# Patient Record
Sex: Female | Born: 1970 | Hispanic: Refuse to answer | Marital: Single | Smoking: Never smoker
Health system: Southern US, Community
[De-identification: ages and names within clinical notes are randomized; demographics above are authoritative.]

## PROBLEM LIST (undated history)

## (undated) DIAGNOSIS — E063 Autoimmune thyroiditis: Secondary | ICD-10-CM

## (undated) DIAGNOSIS — M359 Systemic involvement of connective tissue, unspecified: Secondary | ICD-10-CM

## (undated) DIAGNOSIS — M329 Systemic lupus erythematosus, unspecified: Secondary | ICD-10-CM

## (undated) DIAGNOSIS — G7000 Myasthenia gravis without (acute) exacerbation: Secondary | ICD-10-CM

## (undated) HISTORY — DX: Systemic lupus erythematosus, unspecified: M32.9

## (undated) HISTORY — DX: Myasthenia gravis without (acute) exacerbation: G70.00

## (undated) HISTORY — PX: WISDOM TOOTH EXTRACTION: SHX21

---

## 2008-07-29 ENCOUNTER — Inpatient Hospital Stay (HOSPITAL_COMMUNITY): Admission: AD | Admit: 2008-07-29 | Discharge: 2008-07-29 | Payer: Self-pay | Admitting: Obstetrics & Gynecology

## 2009-07-20 ENCOUNTER — Encounter: Admission: RE | Admit: 2009-07-20 | Discharge: 2009-07-21 | Payer: Self-pay | Admitting: Internal Medicine

## 2011-08-05 LAB — URINALYSIS, ROUTINE W REFLEX MICROSCOPIC
Glucose, UA: 500 — AB
Nitrite: POSITIVE — AB
Specific Gravity, Urine: >1.03 — ABNORMAL HIGH
pH: 6.5

## 2011-08-05 LAB — URINE MICROSCOPIC-ADD ON

## 2012-07-03 ENCOUNTER — Encounter (HOSPITAL_COMMUNITY): Payer: Self-pay | Admitting: Emergency Medicine

## 2012-07-03 ENCOUNTER — Emergency Department (HOSPITAL_COMMUNITY)
Admission: EM | Admit: 2012-07-03 | Discharge: 2012-07-03 | Disposition: A | Discharge status: Home / Self Care | Payer: Self-pay | Source: Home / Self Care | Attending: Emergency Medicine | Admitting: Emergency Medicine

## 2012-07-03 DIAGNOSIS — E039 Hypothyroidism, unspecified: Secondary | ICD-10-CM

## 2012-07-03 HISTORY — DX: Autoimmune thyroiditis: E06.3

## 2012-07-03 HISTORY — DX: Systemic involvement of connective tissue, unspecified: M35.9

## 2012-07-03 MED ORDER — LEVOTHYROXINE SODIUM 125 MCG PO TABS: 125.0000 ug | ORAL_TABLET | Freq: Every day | ORAL | Status: DC

## 2012-07-03 NOTE — ED Provider Notes (Signed)
Chief Complaint  Patient presents with  . Hypothyroidism    History of Present Illness:   Kathryn Collier is a 41 year old female who has an 18 year history of hypothyroidism. This is caused by Hashimoto's thyroiditis. She has undifferentiated connective tissue disorder and is on Plaquenil for that. She's been on Synthroid these past 18 years. About 2 or 3 weeks ago she had a TSH drawn at [facility]. It was found to be elevated at 9.53 and she was told to return here for followup. She is currently on Synthroid 100 mcg per day she does note some symptoms of hypothyroidism including lack of energy, fatigue, tiredness, weight gain, aching in her muscles, particularly her neck, cold intolerance, and constipation. She denies any hair or skin changes.  Review of Systems:  Other than noted above, the patient denies any of the following symptoms. Systemic:  No fever, chills, sweats, fatigue, myalgias, headache, or anorexia. Eye:  No redness, pain or drainage. ENT:  No earache, nasal congestion, rhinorrhea, sinus pressure, or sore throat. Lungs:  No cough, sputum production, wheezing, shortness of breath.  Cardiovascular:  No chest pain, palpitations, or syncope. GI:  No nausea, vomiting, abdominal pain or diarrhea. GU:  No dysuria, frequency, or hematuria. Skin:  No rash or pruritis.  PMFSH:  Past medical history, family history, social history, meds, and allergies were reviewed.   Physical Exam:   Vital signs:  BP 132/91  Pulse 104  Temp 99.3 F (37.4 C) (Oral)  Resp 16  SpO2 99%  LMP 06/22/2012 General:  Alert, in no distress. Eye:  PERRL, full EOMs.  Lids and conjunctivas were normal. Neck:  Supple, no adenopathy, tenderness or mass. Thyroid was normal. Lungs:  No respiratory distress.  Lungs were clear to auscultation, without wheezes, rales or rhonchi.  Breath sounds were clear and equal bilaterally. Heart:  Regular rhythm, without gallops, murmers or rubs. Abdomen:  Soft,  flat, and non-tender to palpation.  No hepatosplenomagaly or mass. Skin:  Clear, warm, and dry, without rash or lesions.  Assessment:  The encounter diagnosis was Hypothyroidism.  Plan:   1.  The following meds were prescribed:   New Prescriptions   LEVOTHYROXINE (SYNTHROID) 125 MCG TABLET    Take 1 tablet (125 mcg total) by mouth daily.   2.  The patient was instructed in symptomatic care and handouts were given. 3.  The patient was told to return if becoming worse in any way, if no better in 3 or 4 days, and given some red flag symptoms that would indicate earlier return. The patient was told to follow up with her primary care physician in about a month for repeat TSH.    Reuben Likes, MD 07/03/12 [phone]

## 2012-07-03 NOTE — ED Notes (Signed)
[facility].  Reports being called recently about July 19 blood draw:reports TSH was called to her as 9.435.

## 2012-08-04 ENCOUNTER — Other Ambulatory Visit (HOSPITAL_COMMUNITY): Payer: Self-pay | Admitting: Family Medicine

## 2012-08-04 ENCOUNTER — Other Ambulatory Visit: Payer: Self-pay | Admitting: Rehabilitation

## 2012-08-04 DIAGNOSIS — E039 Hypothyroidism, unspecified: Secondary | ICD-10-CM

## 2012-08-12 ENCOUNTER — Ambulatory Visit (HOSPITAL_COMMUNITY)
Admission: RE | Admit: 2012-08-12 | Discharge: 2012-08-12 | Disposition: A | Discharge status: Home / Self Care | Payer: Medicaid Other | Source: Ambulatory Visit | Attending: Family Medicine | Admitting: Family Medicine

## 2012-08-12 DIAGNOSIS — E039 Hypothyroidism, unspecified: Secondary | ICD-10-CM

## 2012-08-12 DIAGNOSIS — E0590 Thyrotoxicosis, unspecified without thyrotoxic crisis or storm: Secondary | ICD-10-CM | POA: Insufficient documentation

## 2014-08-24 ENCOUNTER — Other Ambulatory Visit: Payer: Self-pay | Admitting: Obstetrics and Gynecology

## 2014-08-24 DIAGNOSIS — Z1231 Encounter for screening mammogram for malignant neoplasm of breast: Secondary | ICD-10-CM

## 2014-09-13 ENCOUNTER — Other Ambulatory Visit: Payer: Self-pay | Admitting: Obstetrics and Gynecology

## 2014-09-13 ENCOUNTER — Encounter (INDEPENDENT_AMBULATORY_CARE_PROVIDER_SITE_OTHER): Payer: Self-pay

## 2014-09-13 ENCOUNTER — Ambulatory Visit
Admission: RE | Admit: 2014-09-13 | Discharge: 2014-09-13 | Disposition: A | Discharge status: Home / Self Care | Payer: Medicaid Other | Source: Ambulatory Visit | Attending: Obstetrics and Gynecology | Admitting: Obstetrics and Gynecology

## 2014-09-13 DIAGNOSIS — Z1231 Encounter for screening mammogram for malignant neoplasm of breast: Secondary | ICD-10-CM

## 2014-09-14 ENCOUNTER — Other Ambulatory Visit: Payer: Self-pay | Admitting: Obstetrics and Gynecology

## 2014-09-14 DIAGNOSIS — R928 Other abnormal and inconclusive findings on diagnostic imaging of breast: Secondary | ICD-10-CM

## 2014-09-23 ENCOUNTER — Ambulatory Visit
Admission: RE | Admit: 2014-09-23 | Discharge: 2014-09-23 | Disposition: A | Discharge status: Home / Self Care | Payer: No Typology Code available for payment source | Source: Ambulatory Visit | Attending: Obstetrics and Gynecology | Admitting: Obstetrics and Gynecology

## 2014-09-23 DIAGNOSIS — R928 Other abnormal and inconclusive findings on diagnostic imaging of breast: Secondary | ICD-10-CM

## 2014-11-29 ENCOUNTER — Ambulatory Visit: Payer: 59 | Attending: Pulmonary Disease | Admitting: Physical Therapy

## 2014-11-29 ENCOUNTER — Telehealth: Payer: Self-pay | Admitting: *Deleted

## 2014-11-29 DIAGNOSIS — M329 Systemic lupus erythematosus, unspecified: Secondary | ICD-10-CM | POA: Diagnosis not present

## 2014-11-29 DIAGNOSIS — M542 Cervicalgia: Secondary | ICD-10-CM | POA: Diagnosis not present

## 2014-11-29 DIAGNOSIS — M546 Pain in thoracic spine: Secondary | ICD-10-CM | POA: Insufficient documentation

## 2014-11-29 NOTE — Therapy (Addendum)
[place], [place], [address] Phone: [phone]   Fax:  [phone]  Physical Therapy Evaluation/Discharge  Patient Details  Name: Kathryn Collier MRN: [id number] Date of Birth: 03/21/1979 Referring Provider:  Steva Colder, PA-C  Encounter Date: 11/29/2014      PT End of Session - 11/29/14 1217    Visit Number 1   Number of Visits 9   Date for PT Re-Evaluation 01/24/15   PT Start Time 1106   PT Stop Time 1205   PT Time Calculation (min) 59 min   Activity Tolerance Patient tolerated treatment well   Behavior During Therapy [facility] for tasks assessed/performed      Past Medical History  Diagnosis Date  . Connective tissue disease   . Hashimoto's disease     No past surgical history on file.  There were no vitals taken for this visit.  Visit Diagnosis:  Cervicalgia  Lupus (systemic lupus erythematosus)  Bilateral thoracic back pain  Neck pain, bilateral      Subjective Assessment - 11/29/14 1117    Symptoms Pt states she has pain in neck L> R, lupus general pain symptoms/ connective tissue diseases   Pertinent History Lupus, hypothyroidism   Limitations Sitting  drivng car is the worst   How long can you sit comfortably? 5 minutes  especially while driving   How long can you stand comfortably? unlimited   How long can you walk comfortably? unlimited   Patient Stated Goals Pt would like to be able to work out at gym safely, establish a HEP, body mechanics for lifting , caring for children   Currently in Pain? Yes   Pain Score 8   1/10 at rest   Pain Location Neck   Pain Orientation Left;Right   Pain Descriptors / Indicators Tightness;Aching;Throbbing   Pain Type Chronic pain   Pain Onset More than a month ago   Pain Frequency Intermittent   Aggravating Factors  sitting and driving, cold weather, wt lifting acitivities   Pain Relieving Factors heat, medication   Multiple Pain Sites  Yes   Pain Score 8  0/10   Pain Type Chronic pain   Pain Location Jaw  Pt with no pain but at times has bil jaw pain   Pain Orientation Left;Right   Pain Descriptors / Indicators Aching;Spasm   Pain Frequency Occasional   Pain Onset Gradual          [facility] PT Assessment - 11/29/14 1120    Assessment   Medical Diagnosis Lupus related pain, cervicalgia and bilateral  spine pain   Onset Date 11/29/04   Prior Therapy yes  chronic problems   Precautions   Precautions --  photo sensitive   Balance Screen   Has the patient fallen in the past 6 months No   Has the patient had a decrease in activity level because of a fear of falling?  No   Is the patient reluctant to leave their home because of a fear of falling?  No   Home Environment   Living Enviornment Private residence   Living Arrangements Spouse/significant other   Type of [place] to enter   Entrance Stairs-Number of Steps 3   Entrance Stairs-Rails None   Home Layout One level   Prior Function   Level of Independence Independent with basic ADLs;Independent with homemaking with ambulation;Independent with gait   Vocation Part time employment  nanny   Cognition  Overall Cognitive Status Within Functional Limits for tasks assessed   Observation/Other Assessments   Observations slender    Focus on Therapeutic Outcomes (FOTO)  FOTO limitation 39% predicted 32%   Sensation   Light Touch Appears Intact   Posture/Postural Control   Posture/Postural Control Postural limitations   Postural Limitations Rounded Shoulders;Forward head;Anterior pelvic tilt   AROM   Overall AROM  Within functional limits for tasks performed  UE and LE's   Cervical Flexion 35  tightness   Cervical Extension 45  feels like it needs to pop   Cervical - Right Side Bend 42  Pt with end range pop/crack   Cervical - Left Side Bend 34   Cervical - Right Rotation 60   Cervical - Left Rotation 50  ER tightness   Strength    Overall Strength Within functional limits for tasks performed   Overall Strength Comments deep flexior cervical 3-/5 in supine unable to maintain > 10 seconds   Cervical Flexion 4/5   Cervical Extension 4-/5   Cervical - Right Side Bend 4/5   Cervical - Left Side Bend 4/5   Cervical - Right Rotation 4/5   Cervical - Left Rotation 4/5   Palpation   Palpation Tenderness over Left cervical paraspinals > than R                  [facility] Adult PT Treatment/Exercise - 11/29/14 1120    Neck Exercises: Stretches   Upper Trapezius Stretch 30 seconds;2 reps  VC bil   Levator Stretch 30 seconds;2 reps  Bil VC L> R   Chest Stretch 2 reps;30 seconds  VC bil   Other Neck Stretches chin retraction 30 sec 2 reps   Other Neck Stretches scapular Retraction  10 reps   Manual Therapy   Manual Therapy --  for assessment. C-4 more anterior than rest   Joint Mobilization Grade 2 PA                PT Education - 11/29/14 1216    Education provided Yes   Education Details Pt reviewed stretches and emphasized gentleness with stretch from former PT.  Given handout with updated instructions   Person(s) Educated Patient   Methods Explanation;Demonstration;Verbal cues;Handout  for verbal instructions for previous exercises   Comprehension Verbalized understanding          PT Short Term Goals - 11/29/14 1749    PT SHORT TERM GOAL #1   Title Pt will be independent with initial HEP   Time 4   Period Weeks   Status New   PT SHORT TERM GOAL #2   Title Pt will have decrease in neck pain while driving from 1/02 to 4/10   Time 4   Period Weeks   Status New   PT SHORT TERM GOAL #3   Title Pt will be informed of possible community wellness programs and given informatin for energy conservation   Time 4   Period Weeks   Status New           PT Long Term Goals - 11/29/14 1750    PT LONG TERM GOAL #1   Title Pt will be independent with advanced HEP for Core  strengthening/neuromuscular control within pt tolerance   Time 8   Period Weeks   Status New   PT LONG TERM GOAL #2   Title Pt will increase AROM of neck in order to drive with 5/85 or less pain for 1 hour or more  Time 8   Period Weeks   Status New   PT LONG TERM GOAL #3   Title Improve FOTO from39% to at least 32% for maximal functional mobility    Time 8   Period Weeks   Status New   PT LONG TERM GOAL #4   Title Pt will be able return to gym and participate with light weights and low impact aerobics without exacerbating pain.   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  Pt is photo sensitive to light.  Pt has general pain and also specific cervical pain bilateral L> R.  Pt was a swimmer in high school and very active , but photo sensitivity limits time outdoors.  Pt has chronic pain and want to learn to deal with pain and exacerbations.  Pt would like to increase strength of cervical and upper back area in order to return to gym and lift light weights and be proactive about health.  Pt would benefit from skilled PT to update a progressive HEP and to pursue some appropriate community wellness programs.  Pt will benefit from weekly updates to manage exercise through occasional flares with connective tissue disease. Pt will benefit from possible dry needling and other modalities for pain management as pt tolerates. 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   Pt will benefit from skilled therapeutic intervention in order to improve on the following  deficits Pain;Decreased activity tolerance;Decreased endurance;Postural dysfunction;Improper body mechanics;Decreased range of motion;Decreased strength;Increased fascial restricitons;Increased muscle spasms  , connective tissue inflammation control and enery conservation   Rehab Potential Good   PT Frequency 1x / week   PT Duration 8 weeks   PT Treatment/Interventions ADLs/Self Care Home Management;Electrical Stimulation;Cryotherapy;Other (comment);Moist Heat;Ultrasound;Functional mobility training;Therapeutic activities;Therapeutic exercise;Neuromuscular re-education;Patient/family education;Dry needling;Energy conservation;Passive range of motion  iontophoresis   PT Next Visit Plan Continue with possible dry needling and also strengthening for scapular and neck          Problem List There are no active problems to display for this patient.  Voncille Lo, PT 11/29/2014 5:56 PM Phone: [phone] Fax: [place], [place], [address] Phone: [phone]   Fax:  [phone]   By signing I understand that I am ordering/authorizing the use of Iontophoresis using 4 mg/mL of dexamethasone as a component of this plan of care.   Discharged on 01-31-15 due to pt not returning to clinic.  Pt was called and she feels she does not need PT for now.  She would like to save appointments for later if she has a flare of her Lupus symptoms.  Pt will discharge with initial home program.

## 2014-11-29 NOTE — Patient Instructions (Signed)
Continue with Levator stretch and Upper trap stretches that you began at another therapy.  Pt  Reviewed upper trap, levator. Scapular squeeze and pectoral door way  Stretch  Each stretch hold for 30 seconds , rest and then repeat, gentle stretch.  For chin tuck, lie down and nod head 10 times with 3 sec hold at end

## 2014-11-29 NOTE — Telephone Encounter (Signed)
appts made and printed...td 

## 2014-12-06 ENCOUNTER — Ambulatory Visit: Payer: 59 | Admitting: Physical Therapy

## 2014-12-13 ENCOUNTER — Encounter: Payer: 59 | Admitting: Physical Therapy

## 2014-12-20 ENCOUNTER — Encounter: Payer: 59 | Admitting: Physical Therapy

## 2014-12-27 ENCOUNTER — Encounter: Payer: 59 | Admitting: Physical Therapy

## 2015-01-03 ENCOUNTER — Encounter: Payer: 59 | Admitting: Physical Therapy

## 2015-01-10 ENCOUNTER — Encounter: Payer: 59 | Admitting: Physical Therapy

## 2015-01-17 ENCOUNTER — Encounter: Payer: 59 | Admitting: Physical Therapy

## 2015-01-24 ENCOUNTER — Encounter: Payer: 59 | Admitting: Physical Therapy

## 2015-10-26 DIAGNOSIS — Z79899 Other long term (current) drug therapy: Secondary | ICD-10-CM | POA: Insufficient documentation

## 2015-10-26 DIAGNOSIS — M542 Cervicalgia: Secondary | ICD-10-CM | POA: Insufficient documentation

## 2015-10-26 DIAGNOSIS — E039 Hypothyroidism, unspecified: Secondary | ICD-10-CM | POA: Insufficient documentation

## 2015-10-26 DIAGNOSIS — H52223 Regular astigmatism, bilateral: Secondary | ICD-10-CM | POA: Insufficient documentation

## 2015-10-26 DIAGNOSIS — M329 Systemic lupus erythematosus, unspecified: Secondary | ICD-10-CM | POA: Insufficient documentation

## 2016-05-31 DIAGNOSIS — E559 Vitamin D deficiency, unspecified: Secondary | ICD-10-CM | POA: Insufficient documentation

## 2016-12-06 DIAGNOSIS — E063 Autoimmune thyroiditis: Secondary | ICD-10-CM | POA: Insufficient documentation

## 2016-12-06 DIAGNOSIS — J0181 Other acute recurrent sinusitis: Secondary | ICD-10-CM | POA: Insufficient documentation

## 2017-06-10 ENCOUNTER — Encounter (INDEPENDENT_AMBULATORY_CARE_PROVIDER_SITE_OTHER): Payer: Self-pay

## 2017-06-10 ENCOUNTER — Encounter: Payer: Self-pay | Admitting: Diagnostic Neuroimaging

## 2017-06-10 ENCOUNTER — Ambulatory Visit (INDEPENDENT_AMBULATORY_CARE_PROVIDER_SITE_OTHER): Payer: BLUE CROSS/BLUE SHIELD | Admitting: Diagnostic Neuroimaging

## 2017-06-10 VITALS — BP 118/74 | HR 84 | Ht 66.0 in | Wt 149.4 lb

## 2017-06-10 DIAGNOSIS — G7000 Myasthenia gravis without (acute) exacerbation: Secondary | ICD-10-CM

## 2017-06-10 MED ORDER — PYRIDOSTIGMINE BROMIDE 60 MG PO TABS: 30.0000 mg | ORAL_TABLET | Freq: Three times a day (TID) | ORAL | 6 refills | Status: DC

## 2017-06-10 NOTE — Patient Instructions (Signed)
-   start pyridostigmine 30-55m twice a day up to three times per day  - check CT chest (rule out thymoma)  - will look up evobrutinib and discuss with medical monitor for study (about safety of restarting or clinical significant for aggravating MG)

## 2017-06-10 NOTE — Progress Notes (Signed)
[facility]  PATIENT: Kathryn Collier DOB: 12/21/1970  REFERRING CLINICIAN: S Furr HISTORY FROM: patient and SO REASON FOR VISIT: new consult   HISTORICAL  CHIEF COMPLAINT:  Chief Complaint  Patient presents with  . Ptosis    rm 6, New Pt, sig other- Chris, "ptosis, left eye"    HISTORY OF PRESENT ILLNESS:   46 year old female here for evaluation of myasthenia gravis.  Patient has history of SLE, undifferentiated connective tissue disease, Hashimoto's hypothyroidism, currently on hydroxychloroquine, diclofenac, levothyroxine, developed onset of left eye drooping eyelid on 05/03/2017. No double vision. Patient has had intermittent slurred speech and swallow difficulty for past 15 years. Patient went to eye doctor who checked myasthenia gravis antibodies, which returned positive. Patient referred to me for further evaluation.  Of note patient has been involved in phase II clinical research study with medication "evobrutinib" for lupus, from February 25, 2017 - May 20, 2017. Patient also was off of medication from July 9 until July or 13, 2018.   Patient denies any generalized muscle weakness problems, major swallowing or speech difficulties, double vision. Patient continues to have fatigable weakness in left eyelid.    REVIEW OF SYSTEMS: Full 14 system review of systems performed and negative with exception of: Allergies easy bruising obstructed left eye vision.   ALLERGIES: Allergies  Allergen Reactions  . Corticosteroids Other (See Comments)    Rage, irritability, dperession  . Other     Mold, dust mites, pollens of trees/grass  . Prednisone Other (See Comments)    "depression, rage, irritability"  . Sertraline Other (See Comments)    No NSAIDS  . Sulfa Antibiotics     Not supposed to take d/t lupus    HOME MEDICATIONS: Outpatient Medications Prior to Visit  Medication Sig Dispense Refill  . buPROPion (WELLBUTRIN SR) 150 MG 12 hr tablet Take 150  mg by mouth 2 (two) times daily.    . cyclobenzaprine (FLEXERIL) 10 MG tablet Take 10 mg by mouth once.    . diclofenac (VOLTAREN) 75 MG EC tablet Take 75 mg by mouth 2 (two) times daily.    . hydroxychloroquine (PLAQUENIL) 200 MG tablet Take by mouth 2 (two) times daily.    . norethindrone-ethinyl estradiol (MICROGESTIN,JUNEL,LOESTRIN) 1-20 MG-MCG tablet Take 1 tablet by mouth daily.    Marland Kitchen levothyroxine (SYNTHROID) 100 MCG tablet Take 100 mcg by mouth daily.    Marland Kitchen levothyroxine (SYNTHROID) 125 MCG tablet Take 1 tablet (125 mcg total) by mouth daily. (Patient taking differently: Take 112 mcg by mouth daily. ) 30 tablet 11   No facility-administered medications prior to visit.     PAST MEDICAL HISTORY: Past Medical History:  Diagnosis Date  . Connective tissue disease ([place])   . Connective tissue disease ([place])    undifferentiated  . Hashimoto's disease   . Systemic lupus ([place])     PAST SURGICAL HISTORY: No past surgical history on file.  FAMILY HISTORY: Family History  Problem Relation Age of Onset  . Other Mother        brain tumor  . Cancer - Lung Father     SOCIAL HISTORY:  Social History   Social History  . Marital status: Single    Spouse name: N/A  . Number of children: 0  . Years of education: 9   Occupational History  . n     nanny   Social History Main Topics  . Smoking status: Never Smoker  . Smokeless tobacco: Never Used  . Alcohol  use Yes     Comment: 06/10/17 1-3 drinks/ 2-4 nights weekly  . Drug use: No  . Sexual activity: Not on file   Other Topics Concern  . Not on file   Social History Narrative   Lives with boyfriend   Caffeine- coffee, 1 cup daily     PHYSICAL EXAM  GENERAL EXAM/CONSTITUTIONAL: Vitals:  Vitals:   06/10/17 1108  BP: 118/74  Pulse: 84  Weight: 149 lb 6.4 oz (67.8 kg)  Height: 5' 6"  (1.676 m)     Body mass index is 24.11 kg/m.  Visual Acuity Screening   Right eye Left eye Both eyes  Without correction: 20/30  20/40   With correction:        Patient is in no distress; well developed, nourished and groomed; neck is supple  CARDIOVASCULAR:  Examination of carotid arteries is normal; no carotid bruits  Regular rate and rhythm, no murmurs  Examination of peripheral vascular system by observation and palpation is normal  EYES:  Ophthalmoscopic exam of optic discs and posterior segments is normal; no papilledema or hemorrhages  MUSCULOSKELETAL:  Gait, strength, tone, movements noted in Neurologic exam below  NEUROLOGIC: MENTAL STATUS:  No flowsheet data found.  awake, alert, oriented to person, place and time  recent and remote memory intact  normal attention and concentration  language fluent, comprehension intact, naming intact,   fund of knowledge appropriate  CRANIAL NERVE:   2nd - no papilledema on fundoscopic exam  2nd, 3rd, 4th, 6th - pupils equal and reactive to light, visual fields full to confrontation, extraocular muscles intact, no nystagmus; FATIGUABLE LEFT EYE PTOSIS  5th - facial sensation symmetric  7th - facial strength symmetric  8th - hearing intact  9th - palate elevates symmetrically, uvula midline  11th - shoulder shrug symmetric  12th - tongue protrusion midline  MOTOR:   normal bulk and tone, full strength in the BUE, BLE  SENSORY:   normal and symmetric to light touch, temperature, vibration  COORDINATION:   finger-nose-finger, fine finger movements normal  REFLEXES:   deep tendon reflexes present and symmetric  GAIT/STATION:   narrow based gait; romberg is negative    DIAGNOSTIC DATA (LABS, IMAGING, TESTING) - I reviewed patient records, labs, notes, testing and imaging myself where available.  No results found for: WBC, HGB, HCT, MCV, PLT No results found for: NA, K, CL, CO2, GLUCOSE, BUN, CREATININE, CALCIUM, PROT, ALBUMIN, AST, ALT, ALKPHOS, BILITOT, GFRNONAA, GFRAA No results found for: CHOL, HDL, LDLCALC, LDLDIRECT,  TRIG, CHOLHDL No results found for: HGBA1C No results found for: VITAMINB12 No results found for: TSH   05/15/17 AchR binding ab - 2.69 (H)    ASSESSMENT AND PLAN  46 y.o. year old female here with SLE, undifferentiated connective tissue disease, Hashimoto's hypothyroidism, currently on hydroxychloroquine, diclofenac, levothyroxine, developed onset of left eye drooping eyelid on 05/03/2017. AchR ab positive.    Dx: ocular myasthenia  No diagnosis found.   PLAN:  - start pyridostigmine 30-52m twice a day up to three times per day  - check CT chest (rule out thymoma)  - Based upon my review, I do not think study medication evobrutinib is causative in patient's new diagnosis of myasthenia gravis; in addition, we do not know whether patient was in active or placebo arm of the trial. In addition, I am not sure about the safety of restarting this medication in myasthenia gravis; would defer to her rheumatologist and medical study monitor to weigh in on this  decision as well.    Orders Placed This Encounter  Procedures  . CT CHEST W CONTRAST   Meds ordered this encounter  Medications  . pyridostigmine (MESTINON) 60 MG tablet    Sig: Take 0.5-1 tablets (30-60 mg total) by mouth 3 (three) times daily.    Dispense:  60 tablet    Refill:  6   Return in about 3 months (around 09/10/2017).    Penni Bombard, MD 06/07/7840, 28:20 AM Certified in Neurology, Neurophysiology and Neuroimaging  [facility] [address], [place], [place] [address] [phone]

## 2017-06-17 DIAGNOSIS — B009 Herpesviral infection, unspecified: Secondary | ICD-10-CM | POA: Insufficient documentation

## 2017-06-24 ENCOUNTER — Telehealth: Payer: Self-pay | Admitting: *Deleted

## 2017-06-24 ENCOUNTER — Ambulatory Visit
Admission: RE | Admit: 2017-06-24 | Discharge: 2017-06-24 | Disposition: A | Discharge status: Home / Self Care | Payer: BLUE CROSS/BLUE SHIELD | Source: Ambulatory Visit | Attending: Diagnostic Neuroimaging | Admitting: Diagnostic Neuroimaging

## 2017-06-24 ENCOUNTER — Telehealth: Payer: Self-pay | Admitting: Diagnostic Neuroimaging

## 2017-06-24 DIAGNOSIS — G7000 Myasthenia gravis without (acute) exacerbation: Secondary | ICD-10-CM

## 2017-06-24 MED ORDER — IOPAMIDOL (ISOVUE-300) INJECTION 61%
75.0000 mL | Freq: Once | INTRAVENOUS | Status: AC | PRN
  Administered 2017-06-24: 75 mL via INTRAVENOUS

## 2017-06-24 NOTE — Telephone Encounter (Signed)
Received call back from patient . She stated she is now taking Mestinon 30 mg 3 x daily and tolerating the medication well. She stated her eye is 50 % better, but she would like to increase dose again. She is asking the best schedule for increasing.  Advised she increase the morning dose only to 60 mg and give it a few weeks to realize the full effect. Advised she take 8 hours apart as much as possible. Advised her that after a few weeks, if no benefits she may increase the mid day dose. She verbalized understanding, appreciation .

## 2017-06-24 NOTE — Telephone Encounter (Signed)
LVM requesting call back.

## 2017-06-24 NOTE — Telephone Encounter (Signed)
LVM requesting patient call back for CT chest results.

## 2017-06-24 NOTE — Telephone Encounter (Signed)
Patient calling to discuss dosage of pyridostigmine (MESTINON) 60 MG tablet.

## 2017-06-24 NOTE — Telephone Encounter (Signed)
Received call back from patient., informed her that the CT chest showed no thymoma found. Advised her that other incidental findings noted: some reactive lymph nodes noted may be related to lupus. Also a small left kidney stone noted. She denied history of kidney stones. Advised that if she experiences left sided abdominal or back pain she should call her PCP to let him know of scan results and to be followed up. She requested CT results be faxed to her rheumatologist; she will see him soon. She then stated she'll call her PCP to discuss kidney stone and diet changes that he may recommend. She verbalized understanding, appreciation. Routed results notes to Dr Amil Amen, will fax tomorrow.

## 2017-07-14 ENCOUNTER — Ambulatory Visit: Payer: 59 | Admitting: Neurology

## 2017-07-29 ENCOUNTER — Telehealth: Payer: Self-pay | Admitting: Diagnostic Neuroimaging

## 2017-07-29 NOTE — Telephone Encounter (Signed)
Pt states her eye is still drooping and would like to request an increase on pyridostigmine (MESTINON) 60 MG tablet, please call

## 2017-07-29 NOTE — Telephone Encounter (Signed)
LMVM for pt to return call.

## 2017-07-29 NOTE — Telephone Encounter (Signed)
Spoke to pt and relayed that per Dr. Gladstone Lighter note that pt can increase her mestinon to 30-32m po TID.  She is currently taking 614mpo 6am and 3055mt 1-9.  She has noted L eyelid seems thicker, some vision loss/peripherally due to ptosis.  I relayed that she can take 28m87m 6am and 1pm, and keep 9pm dose at 30mg28mKnow that she can increase to taking 28mg 3mid if needed.  She will see how she does in a couple of weeks and let us knoKoreaif she needs to increase again.  Wants to keep us awa[place].

## 2017-09-11 ENCOUNTER — Telehealth: Payer: Self-pay | Admitting: Diagnostic Neuroimaging

## 2017-09-11 NOTE — Telephone Encounter (Signed)
Pt calling to inform that her eyes are acting up again and she is going to increase her medication dosage.  Please call

## 2017-09-11 NOTE — Telephone Encounter (Signed)
Agree. -VRP

## 2017-09-11 NOTE — Telephone Encounter (Signed)
I spoke to pt and she will increase to maximum of mestinon 33m po TID.  She had been taking 60-60-30 and since last week, had busy week, and noted increase in droopy eyelid.  She has appt 09-30-17 at 1500 with Dr. PLeta Baptist  Will let him know. FYI.

## 2017-09-30 ENCOUNTER — Ambulatory Visit: Payer: BLUE CROSS/BLUE SHIELD | Admitting: Diagnostic Neuroimaging

## 2017-09-30 ENCOUNTER — Encounter: Payer: Self-pay | Admitting: Diagnostic Neuroimaging

## 2017-09-30 VITALS — BP 119/76 | HR 89 | Ht 66.0 in | Wt 159.8 lb

## 2017-09-30 DIAGNOSIS — G7000 Myasthenia gravis without (acute) exacerbation: Secondary | ICD-10-CM

## 2017-09-30 NOTE — Patient Instructions (Signed)
-   continue pyridostigmine 22m three times a day; may add 30-669madditional vs 18019mR in future  - consider imuran or cellcept for myasthenia gravis

## 2017-09-30 NOTE — Progress Notes (Signed)
[facility]  PATIENT: Kathryn Collier DOB: 44/02/1971  REFERRING CLINICIAN: S Furr HISTORY FROM: patient REASON FOR VISIT: follow up    HISTORICAL  CHIEF COMPLAINT:  Chief Complaint  Patient presents with  . Follow-up    HISTORY OF PRESENT ILLNESS:   UPDATE (09/30/17, VRP): Since last visit, doing about the same. Tolerating pyridostigmine 67m three times a day. No alleviating or aggravating factors.   PRIOR HPI (06/10/17): 46year old female here for evaluation of myasthenia gravis. Patient has history of SLE, undifferentiated connective tissue disease, Hashimoto's hypothyroidism, currently on hydroxychloroquine, diclofenac, levothyroxine, developed onset of left eye drooping eyelid on 05/03/2017. No double vision. Patient has had intermittent slurred speech and swallow difficulty for past 15 years. Patient went to eye doctor who checked myasthenia gravis antibodies, which returned positive. Patient referred to me for further evaluation. Of note patient has been involved in phase II clinical research study with medication "evobrutinib" for lupus, from February 25, 2017 - May 20, 2017. Patient also was off of medication from July 9 until July or 13, 2018. Patient denies any generalized muscle weakness problems, major swallowing or speech difficulties, double vision. Patient continues to have fatigable weakness in left eyelid.   REVIEW OF SYSTEMS: Full 14 system review of systems performed and negative with exception of: Allergies easy bruising obstructed left eye vision.   ALLERGIES: Allergies  Allergen Reactions  . Corticosteroids Other (See Comments)    Rage, irritability, dperession  . Other     Mold, dust mites, pollens of trees/grass  . Prednisone Other (See Comments)    "depression, rage, irritability"  . Sertraline Other (See Comments)    No NSAIDS  . Sulfa Antibiotics     Not supposed to take d/t lupus    HOME MEDICATIONS: Outpatient Medications  Prior to Visit  Medication Sig Dispense Refill  . buPROPion (WELLBUTRIN SR) 150 MG 12 hr tablet Take 150 mg by mouth 2 (two) times daily.    . Calcium 500-125 MG-UNIT TABS Take by mouth.    . Cholecalciferol (VITAMIN D-1000 MAX ST) 1000 units tablet Take by mouth.    . cyclobenzaprine (FLEXERIL) 10 MG tablet Take 10 mg by mouth once.    . diclofenac (VOLTAREN) 75 MG EC tablet Take 75 mg by mouth 2 (two) times daily.    . hydroxychloroquine (PLAQUENIL) 200 MG tablet Take by mouth 2 (two) times daily.    .Marland Kitchenlevothyroxine (SYNTHROID, LEVOTHROID) 150 MCG tablet 150 mcg daily.    . Magnesium Gluconate 550 MG TABS Take 500 mg by mouth.    . montelukast (SINGULAIR) 10 MG tablet TK 1 T PO QD    . Multiple Vitamin (MULTI-VITAMINS) TABS Take by mouth.    . norethindrone-ethinyl estradiol (MICROGESTIN,JUNEL,LOESTRIN) 1-20 MG-MCG tablet Take 1 tablet by mouth daily.    .Marland Kitchenpyridostigmine (MESTINON) 60 MG tablet Take 0.5-1 tablets (30-60 mg total) by mouth 3 (three) times daily. 60 tablet 6  . triamcinolone (NASACORT ALLERGY 24HR) 55 MCG/ACT AERO nasal inhaler Place 2 sprays into the nose daily.    . vitamin B-12 (CYANOCOBALAMIN) 1000 MCG tablet Take by mouth.    . vitamin C (ASCORBIC ACID) 500 MG tablet Take 500 mg by mouth.     No facility-administered medications prior to visit.     PAST MEDICAL HISTORY: Past Medical History:  Diagnosis Date  . [place]   . Connective tissue disease ([place]    undifferentiated  . Hashimoto's disease   . Systemic lupus (  [place])     PAST SURGICAL HISTORY: No past surgical history on file.  FAMILY HISTORY: Family History  Problem Relation Age of Onset  . Other Mother        brain tumor  . Cancer - Lung Father     SOCIAL HISTORY:  Social History   Socioeconomic History  . Marital status: Single    Spouse name: Not on file  . Number of children: 0  . Years of education: 79  . Highest education level: Not on file  Social Needs  .  Financial resource strain: Not on file  . Food insecurity - worry: Not on file  . Food insecurity - inability: Not on file  . Transportation needs - medical: Not on file  . Transportation needs - non-medical: Not on file  Occupational History  . Occupation: n    Comment: nanny  Tobacco Use  . Smoking status: Never Smoker  . Smokeless tobacco: Never Used  Substance and Sexual Activity  . Alcohol use: Yes    Comment: 06/10/17 1-3 drinks/ 2-4 nights weekly  . Drug use: No  . Sexual activity: Not on file  Other Topics Concern  . Not on file  Social History Narrative   Lives with boyfriend   Caffeine- coffee, 1 cup daily     PHYSICAL EXAM  GENERAL EXAM/CONSTITUTIONAL: Vitals:  Vitals:   09/30/17 1506  BP: 119/76  Pulse: 89  Weight: 159 lb 12.8 oz (72.5 kg)  Height: 5' 6"  (1.676 m)   Body mass index is 25.79 kg/m. No exam data present  Patient is in no distress; well developed, nourished and groomed; neck is supple  CARDIOVASCULAR:  Examination of carotid arteries is normal; no carotid bruits  Regular rate and rhythm, no murmurs  Examination of peripheral vascular system by observation and palpation is normal  EYES:  Ophthalmoscopic exam of optic discs and posterior segments is normal; no papilledema or hemorrhages  MUSCULOSKELETAL:  Gait, strength, tone, movements noted in Neurologic exam below  NEUROLOGIC: MENTAL STATUS:  No flowsheet data found.  awake, alert, oriented to person, place and time  recent and remote memory intact  normal attention and concentration  language fluent, comprehension intact, naming intact,   fund of knowledge appropriate  CRANIAL NERVE:   2nd - no papilledema on fundoscopic exam  2nd, 3rd, 4th, 6th - pupils equal and reactive to light, visual fields full to confrontation, extraocular muscles intact, no nystagmus; MILD LEFT EYE PTOSIS  5th - facial sensation symmetric  7th - facial strength symmetric  8th - hearing  intact  9th - palate elevates symmetrically, uvula midline  11th - shoulder shrug symmetric  12th - tongue protrusion midline  MOTOR:   normal bulk and tone, full strength in the BUE, BLE  SENSORY:   normal and symmetric to light touch, temperature, vibration  COORDINATION:   finger-nose-finger, fine finger movements normal  REFLEXES:   deep tendon reflexes present and symmetric  GAIT/STATION:   narrow based gait; romberg is negative    DIAGNOSTIC DATA (LABS, IMAGING, TESTING) - I reviewed patient records, labs, notes, testing and imaging myself where available.  No results found for: WBC, HGB, HCT, MCV, PLT No results found for: NA, K, CL, CO2, GLUCOSE, BUN, CREATININE, CALCIUM, PROT, ALBUMIN, AST, ALT, ALKPHOS, BILITOT, GFRNONAA, GFRAA No results found for: CHOL, HDL, LDLCALC, LDLDIRECT, TRIG, CHOLHDL No results found for: HGBA1C No results found for: VITAMINB12 No results found for: TSH   05/15/17 AchR binding ab -  2.69 (H)    ASSESSMENT AND PLAN  46 y.o. year old female here with SLE, undifferentiated connective tissue disease, Hashimoto's hypothyroidism, currently on hydroxychloroquine, diclofenac, levothyroxine, developed onset of left eye drooping eyelid on 05/03/2017. AchR ab positive.    Dx: ocular myasthenia  1. Ocular myasthenia (HCC)      PLAN:  - continue pyridostigmine 50m three times a day; may add 30-671madditional vs 18023mR in future  - consider imuran or cellcept for myasthenia gravis  Return in about 4 months (around 01/28/2018).    VIKPenni BombardD 11/76/80/8811:10:31 Certified in Neurology, Neurophysiology and Neuroimaging  [facility] [id number]eth[address] TowneTrimbleC [id number]

## 2017-10-06 MED ORDER — PYRIDOSTIGMINE BROMIDE 60 MG PO TABS: 60.0000 mg | ORAL_TABLET | Freq: Three times a day (TID) | ORAL | 6 refills | Status: DC

## 2017-10-06 NOTE — Addendum Note (Signed)
Addended by: Eular Melnick on: 10/06/2017 01:27 PM   Modules accepted: Orders

## 2017-10-06 NOTE — Telephone Encounter (Signed)
Pt called she is taking medication 76m 1 tab tid. She needs updated qty sent to [facility]/[place]

## 2017-11-05 ENCOUNTER — Encounter: Payer: Self-pay | Admitting: Diagnostic Neuroimaging

## 2017-11-11 ENCOUNTER — Telehealth: Payer: Self-pay | Admitting: Diagnostic Neuroimaging

## 2017-11-11 NOTE — Telephone Encounter (Signed)
Called patient who stated she still didn't see Dr Gladstone Lighter e mail. This RN sent her Dr Gladstone Lighter my chart reply, and patient stated she was then able to see it. She remains concerned about her rheumatologist and Dr Leta Baptist coordinating her care and medications for her medical conditions.  She stated she will discuss Dr Gladstone Lighter information with other provider and contact this office again. She stated she may want to discuss Imuran with Dr Leta Baptist and would like to see him sooner than May. This RN advised she call with update and if needed, a sooner follow up can be scheduled. Patient verbalized understanding, appreciation.

## 2017-11-11 NOTE — Telephone Encounter (Signed)
Pt called she has not rec'd a call or email reg her concerns. I told her Dr Mamie Nick had responded to her email but sent to [facility]/RN. Pt is seeing PCP today at 2pm and would like to speak with [facility]/RN prior to the appt. Thank you.

## 2017-11-11 NOTE — Telephone Encounter (Signed)
LVM informing patient that Dr Leta Baptist replied to her my chart message on 11/06/17 with a long reply. Advised her my chart appears to be active. Suggested she check it, print his e-mail if she is able. Left number for questions.

## 2017-11-11 NOTE — Telephone Encounter (Signed)
Pt is returning call, stating that she has checked my chart but doesn't see any messages from anyone. Pt is wondering if we can print them out and she can pick them up today around lunch for her or to e-mail them. Please call to let her know

## 2017-11-12 NOTE — Telephone Encounter (Signed)
Noted  

## 2017-11-13 ENCOUNTER — Encounter: Payer: Self-pay | Admitting: Diagnostic Neuroimaging

## 2017-12-01 ENCOUNTER — Telehealth: Payer: Self-pay | Admitting: Diagnostic Neuroimaging

## 2017-12-01 NOTE — Telephone Encounter (Signed)
I called pt and relayed that per Dr. Leta Baptist ok to take flexeril for a few weeks if needed.

## 2017-12-01 NOTE — Telephone Encounter (Signed)
Takes mestinon for myasthenia gravis.

## 2017-12-01 NOTE — Telephone Encounter (Signed)
Pt has called to inform that by accident she sprained her back and would like to know how Dr Leta Baptist feels about her taking Cylobenzaprine(prescribed by another doctor) for the pain.  Please call

## 2017-12-01 NOTE — Telephone Encounter (Signed)
Ok to take cyclobenzaprine for a few weeks if needed. -VRP

## 2017-12-09 ENCOUNTER — Encounter: Payer: Self-pay | Admitting: Diagnostic Neuroimaging

## 2017-12-09 ENCOUNTER — Telehealth: Payer: Self-pay | Admitting: Diagnostic Neuroimaging

## 2017-12-09 NOTE — Telephone Encounter (Signed)
Dr. Leta Baptist will be out of the office on 03-04-18 and patient had to reschedule appointment for 05-27-18. I didt put her on a waiting list in case there is a cancellation. She feels like she needs to be seen sooner.

## 2017-12-09 NOTE — Telephone Encounter (Signed)
Called patient who was to be seen by Dr Leta Baptist in late March 2019 per office note from Nov. This RN rescheduled patient for 02/04/18. Advised she call sooner for any problems, questions.  She stated that currently she is doing well; she stated that she preferred sooner follow up as she is newly diagnosed. . She verbalized understanding, appreciation for call.

## 2017-12-23 DIAGNOSIS — G7000 Myasthenia gravis without (acute) exacerbation: Secondary | ICD-10-CM | POA: Insufficient documentation

## 2017-12-23 DIAGNOSIS — K625 Hemorrhage of anus and rectum: Secondary | ICD-10-CM | POA: Insufficient documentation

## 2018-02-04 ENCOUNTER — Encounter: Payer: Self-pay | Admitting: Diagnostic Neuroimaging

## 2018-02-04 ENCOUNTER — Ambulatory Visit: Payer: BLUE CROSS/BLUE SHIELD | Admitting: Diagnostic Neuroimaging

## 2018-02-04 VITALS — BP 125/70 | HR 94 | Ht 66.0 in | Wt 154.0 lb

## 2018-02-04 DIAGNOSIS — G7000 Myasthenia gravis without (acute) exacerbation: Secondary | ICD-10-CM

## 2018-02-04 MED ORDER — PYRIDOSTIGMINE BROMIDE 60 MG PO TABS: 60.0000 mg | ORAL_TABLET | Freq: Three times a day (TID) | ORAL | 12 refills | Status: DC

## 2018-02-04 NOTE — Progress Notes (Signed)
[facility]  PATIENT: Kathryn Collier DOB: 10/24/1971  REFERRING CLINICIAN: S Furr HISTORY FROM: patient REASON FOR VISIT: follow up    HISTORICAL  CHIEF COMPLAINT:  Chief Complaint  Patient presents with  . Ocular myasthenia    rm 7, "feeling of swelling, pressure on left eye"  . Follow-up    4 month    HISTORY OF PRESENT ILLNESS:   UPDATE (02/04/18, VRP): Since last visit, doing well. Tolerating meds. No alleviating or aggravating factors. Left ptosis is intermittent. Taking pyridostigmine (6am 45m, 11am 636m 4pm 3040m9pm 48m37mSome anxiety related to long term symptoms and disease prognosis.   UPDATE (09/30/17, VRP): Since last visit, doing about the same. Tolerating pyridostigmine 60mg80mee times a day. No alleviating or aggravating factors.   PRIOR HPI (06/10/17): 45 ye26 old female here for evaluation of myasthenia gravis. Patient has history of SLE, undifferentiated connective tissue disease, Hashimoto's hypothyroidism, currently on hydroxychloroquine, diclofenac, levothyroxine, developed onset of left eye drooping eyelid on 05/03/2017. No double vision. Patient has had intermittent slurred speech and swallow difficulty for past 15 years. Patient went to eye doctor who checked myasthenia gravis antibodies, which returned positive. Patient referred to me for further evaluation. Of note patient has been involved in phase II clinical research study with medication "evobrutinib" for lupus, from February 25, 2017 - May 20, 2017. Patient also was off of medication from July 9 until July or 13, 2018. Patient denies any generalized muscle weakness problems, major swallowing or speech difficulties, double vision. Patient continues to have fatigable weakness in left eyelid.   REVIEW OF SYSTEMS: Full 14 system review of systems performed and negative with exception of: fatigue heat intolerance light sens.     ALLERGIES: Allergies  Allergen Reactions  .  Corticosteroids Other (See Comments)    Rage, irritability, dperession  . Other     Mold, dust mites, pollens of trees/grass  . Prednisone Other (See Comments)    "depression, rage, irritability"  . Sertraline Other (See Comments)    No SSRIs  . Sulfa Antibiotics     Not supposed to take d/t lupus    HOME MEDICATIONS: Outpatient Medications Prior to Visit  Medication Sig Dispense Refill  . buPROPion (WELLBUTRIN SR) 150 MG 12 hr tablet Take 150 mg by mouth 2 (two) times daily.    . Cholecalciferol (VITAMIN D-1000 MAX ST) 1000 units tablet Take by mouth.    . cyclobenzaprine (FLEXERIL) 10 MG tablet Take 10 mg by mouth once.    . diclofenac (VOLTAREN) 75 MG EC tablet Take 75 mg by mouth 2 (two) times daily.    . diclofenac sodium (VOLTAREN) 1 % GEL   2  . hydroxychloroquine (PLAQUENIL) 200 MG tablet Take by mouth 2 (two) times daily.    . levMarland Kitchenthyroxine (SYNTHROID, LEVOTHROID) 150 MCG tablet 150 mcg daily.    . Magnesium Gluconate 550 MG TABS Take 500 mg by mouth.    . montelukast (SINGULAIR) 10 MG tablet TK 1 T PO QD    . norethindrone (NORLYDA) 0.35 MG tablet daily.    . pyrMarland Kitchendostigmine (MESTINON) 60 MG tablet Take 1 tablet (60 mg total) by mouth 3 (three) times daily. 90 tablet 6  . vitamin B-12 (CYANOCOBALAMIN) 1000 MCG tablet Take by mouth.    . vitamin C (ASCORBIC ACID) 500 MG tablet Take 500 mg by mouth.    . Multiple Vitamin (MULTI-VITAMINS) TABS Take by mouth.    . triamcinolone (NASACORT ALLERGY 24HR) 55 MCG/ACT AERO nasal inhaler  Place 2 sprays into the nose daily.    . Calcium 500-125 MG-UNIT TABS Take by mouth.    . norethindrone-ethinyl estradiol (MICROGESTIN,JUNEL,LOESTRIN) 1-20 MG-MCG tablet Take 1 tablet by mouth daily.     No facility-administered medications prior to visit.     PAST MEDICAL HISTORY: Past Medical History:  Diagnosis Date  . Connective tissue disease ([place])   . Connective tissue disease ([place])    undifferentiated  . Hashimoto's disease   .  Systemic lupus ([place])     PAST SURGICAL HISTORY: No past surgical history on file.  FAMILY HISTORY: Family History  Problem Relation Age of Onset  . Other Mother        brain tumor  . Cancer - Lung Father     SOCIAL HISTORY:  Social History   Socioeconomic History  . Marital status: Single    Spouse name: Not on file  . Number of children: 0  . Years of education: 44  . Highest education level: Not on file  Occupational History  . Occupation: n    Comment: nanny  Social Needs  . Financial resource strain: Not on file  . Food insecurity:    Worry: Not on file    Inability: Not on file  . Transportation needs:    Medical: Not on file    Non-medical: Not on file  Tobacco Use  . Smoking status: Never Smoker  . Smokeless tobacco: Never Used  Substance and Sexual Activity  . Alcohol use: Yes    Comment: 06/10/17 1-3 drinks/ 2-4 nights weekly  . Drug use: No  . Sexual activity: Not on file  Lifestyle  . Physical activity:    Days per week: Not on file    Minutes per session: Not on file  . Stress: Not on file  Relationships  . Social connections:    Talks on phone: Not on file    Gets together: Not on file    Attends religious service: Not on file    Active member of club or organization: Not on file    Attends meetings of clubs or organizations: Not on file    Relationship status: Not on file  . Intimate partner violence:    Fear of current or ex partner: Not on file    Emotionally abused: Not on file    Physically abused: Not on file    Forced sexual activity: Not on file  Other Topics Concern  . Not on file  Social History Narrative   Lives with boyfriend   Caffeine- coffee, 1 cup daily     PHYSICAL EXAM  GENERAL EXAM/CONSTITUTIONAL: Vitals:  Vitals:   02/04/18 1245  BP: 125/70  Pulse: 94  Weight: 154 lb (69.9 kg)  Height: 5' 6"  (1.676 m)   Body mass index is 24.86 kg/m. No exam data present  Patient is in no distress; well developed,  nourished and groomed; neck is supple  CARDIOVASCULAR:  Examination of carotid arteries is normal; no carotid bruits  Regular rate and rhythm, no murmurs  Examination of peripheral vascular system by observation and palpation is normal  EYES:  Ophthalmoscopic exam of optic discs and posterior segments is normal; no papilledema or hemorrhages  MUSCULOSKELETAL:  Gait, strength, tone, movements noted in Neurologic exam below  NEUROLOGIC: MENTAL STATUS:  No flowsheet data found.  awake, alert, oriented to person, place and time  recent and remote memory intact  normal attention and concentration  language fluent, comprehension intact, naming intact,  fund of knowledge appropriate  CRANIAL NERVE:   2nd - no papilledema on fundoscopic exam  2nd, 3rd, 4th, 6th - pupils equal and reactive to light, visual fields full to confrontation, extraocular muscles intact, no nystagmus; MILD LEFT EYE PTOSIS AFTER 1 MINUTE UPGAZE  5th - facial sensation symmetric  7th - facial strength symmetric  8th - hearing intact  9th - palate elevates symmetrically, uvula midline  11th - shoulder shrug symmetric  12th - tongue protrusion midline  MOTOR:   normal bulk and tone, full strength in the BUE, BLE  SENSORY:   normal and symmetric to light touch, temperature, vibration  COORDINATION:   finger-nose-finger, fine finger movements normal  REFLEXES:   deep tendon reflexes present and symmetric  GAIT/STATION:   narrow based gait; romberg is negative    DIAGNOSTIC DATA (LABS, IMAGING, TESTING) - I reviewed patient records, labs, notes, testing and imaging myself where available.  No results found for: WBC, HGB, HCT, MCV, PLT No results found for: NA, K, CL, CO2, GLUCOSE, BUN, CREATININE, CALCIUM, PROT, ALBUMIN, AST, ALT, ALKPHOS, BILITOT, GFRNONAA, GFRAA No results found for: CHOL, HDL, LDLCALC, LDLDIRECT, TRIG, CHOLHDL No results found for: HGBA1C No results found  for: VITAMINB12 No results found for: TSH   05/15/17 AchR binding ab - 2.69 (H)  06/24/17 CT chest - No evidence of thymoma. - Scattered lymph nodes in the axilla and subpectoral regions bilaterally. These are likely reactive in nature and may be related to the patient's underlying history of lupus. Short-term follow-up in 6 months is recommended to assess for stability/resolution. - Nonobstructing left renal stone.     ASSESSMENT AND PLAN  47 y.o. year old female here with SLE, undifferentiated connective tissue disease, Hashimoto's hypothyroidism, currently on hydroxychloroquine, diclofenac, levothyroxine, developed onset of left eye drooping eyelid on 05/03/2017. AchR ab positive.    Dx: ocular myasthenia  1. Ocular myasthenia (HCC)      PLAN:  - continue pyridostigmine up to 90m three times a day; may add 30-686madditional vs 18045mR in future - in future, could consider imuran or cellcept for myasthenia gravis  Meds ordered this encounter  Medications  . pyridostigmine (MESTINON) 60 MG tablet    Sig: Take 1 tablet (60 mg total) by mouth 3 (three) times daily.    Dispense:  90 tablet    Refill:  12   Return in about 1 year (around 02/05/2019).    VIKPenni BombardD 4/30/04/155:11:53 Certified in Neurology, Neurophysiology and Neuroimaging  [facility] [id number] [id number]

## 2018-03-04 ENCOUNTER — Ambulatory Visit: Payer: BLUE CROSS/BLUE SHIELD | Admitting: Diagnostic Neuroimaging

## 2018-05-27 ENCOUNTER — Ambulatory Visit: Payer: BLUE CROSS/BLUE SHIELD | Admitting: Diagnostic Neuroimaging

## 2018-05-27 ENCOUNTER — Encounter

## 2018-08-19 DIAGNOSIS — R6884 Jaw pain: Secondary | ICD-10-CM | POA: Insufficient documentation

## 2019-02-08 ENCOUNTER — Ambulatory Visit: Payer: BLUE CROSS/BLUE SHIELD | Admitting: Diagnostic Neuroimaging

## 2019-04-01 DIAGNOSIS — N644 Mastodynia: Secondary | ICD-10-CM | POA: Insufficient documentation

## 2019-04-01 DIAGNOSIS — N6452 Nipple discharge: Secondary | ICD-10-CM | POA: Insufficient documentation

## 2019-05-01 ENCOUNTER — Other Ambulatory Visit: Payer: Self-pay | Admitting: Diagnostic Neuroimaging

## 2019-06-07 DIAGNOSIS — D849 Immunodeficiency, unspecified: Secondary | ICD-10-CM | POA: Insufficient documentation

## 2019-06-07 DIAGNOSIS — F3342 Major depressive disorder, recurrent, in full remission: Secondary | ICD-10-CM | POA: Insufficient documentation

## 2019-06-07 DIAGNOSIS — B029 Zoster without complications: Secondary | ICD-10-CM | POA: Insufficient documentation

## 2019-06-07 DIAGNOSIS — F064 Anxiety disorder due to known physiological condition: Secondary | ICD-10-CM | POA: Insufficient documentation

## 2019-07-27 ENCOUNTER — Other Ambulatory Visit: Payer: Self-pay

## 2019-07-27 DIAGNOSIS — Z20822 Contact with and (suspected) exposure to covid-19: Secondary | ICD-10-CM

## 2019-07-28 LAB — NOVEL CORONAVIRUS, NAA: SARS-CoV-2, NAA: NOT DETECTED

## 2019-11-08 ENCOUNTER — Encounter: Payer: Self-pay | Admitting: Family Medicine

## 2019-11-08 ENCOUNTER — Other Ambulatory Visit: Payer: Self-pay

## 2019-11-08 ENCOUNTER — Ambulatory Visit: Payer: Self-pay | Admitting: Family Medicine

## 2019-11-08 VITALS — BP 108/73 | HR 74

## 2019-11-08 DIAGNOSIS — G7000 Myasthenia gravis without (acute) exacerbation: Secondary | ICD-10-CM | POA: Diagnosis not present

## 2019-11-08 DIAGNOSIS — Z0000 Encounter for general adult medical examination without abnormal findings: Secondary | ICD-10-CM | POA: Insufficient documentation

## 2019-11-08 DIAGNOSIS — E038 Other specified hypothyroidism: Secondary | ICD-10-CM | POA: Diagnosis not present

## 2019-11-08 DIAGNOSIS — G8929 Other chronic pain: Secondary | ICD-10-CM

## 2019-11-08 DIAGNOSIS — M25512 Pain in left shoulder: Secondary | ICD-10-CM

## 2019-11-08 DIAGNOSIS — E063 Autoimmune thyroiditis: Secondary | ICD-10-CM | POA: Diagnosis not present

## 2019-11-08 DIAGNOSIS — E559 Vitamin D deficiency, unspecified: Secondary | ICD-10-CM

## 2019-11-08 DIAGNOSIS — F3341 Major depressive disorder, recurrent, in partial remission: Secondary | ICD-10-CM

## 2019-11-08 DIAGNOSIS — J45909 Unspecified asthma, uncomplicated: Secondary | ICD-10-CM | POA: Insufficient documentation

## 2019-11-08 DIAGNOSIS — M329 Systemic lupus erythematosus, unspecified: Secondary | ICD-10-CM | POA: Diagnosis not present

## 2019-11-08 NOTE — Progress Notes (Addendum)
Office Visit Note   Patient: Kathryn Collier           Date of Birth: 1970-11-15           MRN: [id number] Visit Date: 11/08/2019 Requested by: Karleen Hampshire., MD [id number] [place],  [place] [address] PCP: Karleen Hampshire., MD  Subjective: Chief Complaint  Patient presents with  . establish PCP    HPI: She is here to establish care.  She is looking for a PCP who is more open to alternative treatments.  As a teenager she was diagnosed with systemic lupus.  She had depression symptoms for a couple years and then she developed a facial rash which led to her diagnosis.  It has been pretty well controlled with Plaquenil.  A few years ago she was diagnosed with myasthenia gravis.  She had left eye ptosis.  She is no longer on medication, and symptoms seem to be manageable right now.  She has Hashimoto's thyroiditis.  She has not had labs in a couple of years.  She has been treated with about 75 mcg of Synthroid.  Recently she has had some depression symptoms but she thinks it is due to current circumstances.  In the past she was treated with bupropion for depression with fairly good results.  She tapered off this past summer.  She was trying to eliminate as much medication as she could.  She has a history of exercise-induced asthma when she was younger.  She does not have true asthma.  She does not need inhalers.  She has vitamin D deficiency and is taking about 4000 units daily.  She has not had her levels checked in a while.  Also having left shoulder blade pain and crepitus.  PT with [facility] helps a lot, but needs someone in network.                ROS: No fevers or chills.  No bowel or bladder dysfunction.  All other systems were reviewed and are negative.  Objective: Vital Signs: BP 108/73   Pulse 74   Physical Exam:  General:  Alert and oriented, in no acute distress. Pulm:  Breathing unlabored. Psy:  Normal mood, congruent affect. Skin: No rash  today. HEENT:  [place]/AT, PERRLA, EOM Full, no nystagmus.  Funduscopic examination within normal limits.  No conjunctival erythema.  Tympanic membranes are pearly gray with normal landmarks.  External ear canals are normal.  Nasal passages are clear.  No significant lymphadenopathy.  No thyromegaly or nodules.  2+ carotid pulses without bruits. CV: Regular rate and rhythm without murmurs, rubs, or gallops.  No peripheral edema.  2+ radial and posterior tibial pulses. Lungs: Clear to auscultation throughout with no wheezing or areas of consolidation. Abdomen: No hepatosplenomegaly. Extremities: 2+ DTRs. Left shoulder:  Full ROM.  Has probable scapulothoracic bursitis with crepitus.    Imaging: None today  Assessment & Plan: 1.  Systemic lupus and myasthenia gravis, currently stable. -Monitored by neurology and rheumatology. - Try lifestyle/diet changes.  2.  Hashimoto's thyroiditis -Labs to evaluate.  Adjust dosage if needed.  Follow-up in about 6 months.  3.  Depression -If thyroid labs are normal, we may resume bupropion.  4.  Vitamin D deficiency -Recheck levels today.  5.  Left shoulder scapulothoracic bursitis - Referral to [facility] at [facility] location.     Procedures: No procedures performed  No notes on file     PMFS History: Patient  Active Problem List   Diagnosis Date Noted  . Asthma 11/08/2019  . Wellness examination 11/08/2019  . Myasthenia gravis ([place]) 12/23/2017  . Hashimoto's disease 12/06/2016  . Vitamin D deficiency 05/31/2016  . Systemic lupus erythematosus ([place]) 10/26/2015  . Hypothyroidism 10/26/2015   Past Medical History:  Diagnosis Date  . Connective tissue disease ([place])   . Connective tissue disease ([place])    undifferentiated  . Hashimoto's disease   . Systemic lupus ([facility])     Family History  Problem Relation Age of Onset  . Other Mother        brain tumor  . Cancer - Lung Father     History reviewed. No pertinent surgical  history. Social History   Occupational History  . Occupation: n    Comment: nanny  Tobacco Use  . Smoking status: Never Smoker  . Smokeless tobacco: Never Used  Substance and Sexual Activity  . Alcohol use: Yes    Comment: 06/10/17 1-3 drinks/ 2-4 nights weekly  . Drug use: No  . Sexual activity: Not on file

## 2019-11-09 ENCOUNTER — Telehealth: Payer: Self-pay | Admitting: Family Medicine

## 2019-11-09 ENCOUNTER — Encounter: Payer: Self-pay | Admitting: Family Medicine

## 2019-11-09 LAB — VITAMIN D 25 HYDROXY (VIT D DEFICIENCY, FRACTURES): Vit D, 25-Hydroxy: 84 ng/mL (ref 30–100)

## 2019-11-09 LAB — THYROID PANEL WITH TSH
Free Thyroxine Index: 4.4 — ABNORMAL HIGH (ref 1.4–3.8)
T3 Uptake: 38 % — ABNORMAL HIGH (ref 22–35)
T4, Total: 11.7 ug/dL (ref 5.1–11.9)
TSH: 4.59 mIU/L — ABNORMAL HIGH

## 2019-11-09 LAB — THYROID PEROXIDASE ANTIBODY: Thyroperoxidase Ab SerPl-aCnc: 274 IU/mL — ABNORMAL HIGH (ref <9)

## 2019-11-09 MED ORDER — LEVOTHYROXINE SODIUM 112 MCG PO TABS: 112.0000 ug | ORAL_TABLET | Freq: Every day | ORAL | 6 refills | Status: DC

## 2019-11-09 NOTE — Telephone Encounter (Signed)
TSH is elevated at 4.59.  Goal level is around 0.5-1.0.    Just to confirm, your current weekly dosage is 75 mcg five days per week and 150 mcg two days per week, is that right?

## 2019-11-09 NOTE — Telephone Encounter (Signed)
Thyroid antibodies are strongly positive at 274.

## 2019-11-09 NOTE — Telephone Encounter (Signed)
Patient gave me access to her lab results from her previous endocrinologist.  For some reason we are not able to view them through epic.  TSH values are as follows:  December 23, 2017: 0.336 April 15, 2018: 0.013 June 23, 2018: Less than 0.01 August 19, 2018: 0.061 February 11, 2019: 2.091 August 02, 2019: 2.247  TPO antibodies December 23, 2017: 151

## 2019-11-09 NOTE — Telephone Encounter (Signed)
Thyroid antibodies are still pending.  Trying to track down TSH results.  Free thyroxine index is slightly high, and T4 is at the high-normal range.  This would be consistent with your taking Synthroid.  Whether to adjust your dosage will depend on TSH level.  Vitamin D looks perfect.

## 2019-11-09 NOTE — Addendum Note (Signed)
Addended by: Hortencia Pilar on: 11/09/2019 01:04 PM   Modules accepted: Orders

## 2019-11-23 ENCOUNTER — Ambulatory Visit: Payer: PRIVATE HEALTH INSURANCE | Admitting: Family Medicine

## 2019-11-23 ENCOUNTER — Encounter: Payer: Self-pay | Admitting: Family Medicine

## 2019-11-23 ENCOUNTER — Other Ambulatory Visit: Payer: Self-pay

## 2019-11-23 ENCOUNTER — Telehealth: Payer: Self-pay | Admitting: Family Medicine

## 2019-11-23 VITALS — BP 110/74 | HR 88

## 2019-11-23 DIAGNOSIS — E038 Other specified hypothyroidism: Secondary | ICD-10-CM

## 2019-11-23 DIAGNOSIS — R5383 Other fatigue: Secondary | ICD-10-CM | POA: Diagnosis not present

## 2019-11-23 DIAGNOSIS — F439 Reaction to severe stress, unspecified: Secondary | ICD-10-CM | POA: Diagnosis not present

## 2019-11-23 DIAGNOSIS — E063 Autoimmune thyroiditis: Secondary | ICD-10-CM

## 2019-11-23 DIAGNOSIS — F488 Other specified nonpsychotic mental disorders: Secondary | ICD-10-CM | POA: Diagnosis not present

## 2019-11-23 DIAGNOSIS — R4189 Other symptoms and signs involving cognitive functions and awareness: Secondary | ICD-10-CM

## 2019-11-23 NOTE — Progress Notes (Signed)
   Office Visit Note   Patient: Kathryn Collier           Date of Birth: 1971-05-08           MRN: [id number] Visit Date: 11/23/2019 Requested by: Karleen Hampshire., MD [address] [place],  [place] [address] PCP: Karleen Hampshire., MD  Subjective: Chief Complaint  Patient presents with  . discuss thyroid medication and symptoms she is having    HPI: She is here to discuss her symptoms.  She started Synthroid 112 mcg about 2 weeks ago.  At first she felt improvement in her fatigue symptoms but now her symptoms are fluctuating.  She feels occasionally jittery, no diarrhea or constipation.  She has been under some stress recently because her long-term boyfriend's father just died and his mother lives in [place].  They may need to move down there soon.  This is definitely stressful for her but she is working with a therapist and that seems to be helping.  She does have some trouble sleeping and her appetite is increased.  She also notices brain fog.  Recently she has had some dry sinus passages and plans to start using triamcinolone nasal spray.              ROS: No fever or chills.  All other systems were reviewed and are negative.  Objective: Vital Signs: BP 110/74   Pulse 88   Physical Exam:  General:  Alert and oriented, in no acute distress. Pulm:  Breathing unlabored. Psy:  Normal mood, congruent affect. Skin: No visible rash. No exam done today.  Imaging: None today  Assessment & Plan: 1.  Hypothyroidism -Reassurance that a lot of her symptoms are probably due to hypothyroidism and hopefully will improve as we get her thyroid regulated.  We will plan on rechecking TSH in about 6 weeks.  2.  Situational stress  3.  Allergic type symptoms -She will add quercetin to her regimen as well as start using a Neti pot.     Procedures: No procedures performed  No notes on file     PMFS History: Patient Active Problem List   Diagnosis Date Noted  . Asthma 11/08/2019   . Wellness examination 11/08/2019  . Myasthenia gravis ([facility]) 12/23/2017  . Hashimoto's disease 12/06/2016  . Vitamin D deficiency 05/31/2016  . Systemic lupus erythematosus ([facility]) 10/26/2015  . Hypothyroidism 10/26/2015   Past Medical History:  Diagnosis Date  . Connective tissue disease ([facility])   . 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 Hashimoto's disease   . Systemic lupus ([facility])     Family History  Problem Relation Age of Onset  . Other Mother        brain tumor  . Cancer - Lung Father     History reviewed. No pertinent surgical history. Social History   Occupational History  . Occupation: n    Comment: nanny  Tobacco Use  . Smoking status: Never Smoker  . Smokeless tobacco: Never Used  Substance and Sexual Activity  . Alcohol use: Yes    Comment: 06/10/17 1-3 drinks/ 2-4 nights weekly  . Drug use: No  . Sexual activity: Not on file

## 2019-11-23 NOTE — Telephone Encounter (Signed)
Patient was told to schedule for blood work 6 weeks out but was unsure if it was six week from today's appointment or 6 weeks from the time she started her new medication.   After that is confirmed, either myself or you guys need to call and get it scheduled.   Call back number: [phone]

## 2019-11-23 NOTE — Telephone Encounter (Signed)
6 weeks from today.

## 2019-11-23 NOTE — Telephone Encounter (Signed)
Please clarify

## 2019-11-25 ENCOUNTER — Telehealth: Payer: Self-pay | Admitting: Family Medicine

## 2019-11-25 DIAGNOSIS — G8929 Other chronic pain: Secondary | ICD-10-CM

## 2019-11-25 NOTE — Telephone Encounter (Signed)
I called the patient to let her know the referral has been placed now. She said she has already been called by the PT facility.

## 2019-11-25 NOTE — Telephone Encounter (Signed)
Not sure what happened, but I just now sent it again.

## 2019-11-25 NOTE — Telephone Encounter (Signed)
Patient called advised she spoke with (PT) at [facility] and was told the referral is not showing in Epic yet fr them to make an appointment for her. Patient said the referral should be sent to Ruben Im at [facility] [place]     The phone number is [phone]    The number to contact patient is [phone]

## 2019-11-29 ENCOUNTER — Encounter: Payer: Self-pay | Admitting: Family Medicine

## 2019-11-29 MED ORDER — NORETHINDRONE 0.35 MG PO TABS: 1.0000 | ORAL_TABLET | Freq: Every day | ORAL | 11 refills | Status: DC

## 2019-11-30 ENCOUNTER — Other Ambulatory Visit: Payer: Self-pay

## 2019-11-30 ENCOUNTER — Ambulatory Visit: Payer: 59 | Attending: Family Medicine | Admitting: Physical Therapy

## 2019-11-30 DIAGNOSIS — G8929 Other chronic pain: Secondary | ICD-10-CM | POA: Diagnosis present

## 2019-11-30 DIAGNOSIS — M25612 Stiffness of left shoulder, not elsewhere classified: Secondary | ICD-10-CM | POA: Insufficient documentation

## 2019-11-30 DIAGNOSIS — M542 Cervicalgia: Secondary | ICD-10-CM

## 2019-11-30 DIAGNOSIS — M25512 Pain in left shoulder: Secondary | ICD-10-CM | POA: Insufficient documentation

## 2019-11-30 DIAGNOSIS — M6281 Muscle weakness (generalized): Secondary | ICD-10-CM | POA: Diagnosis present

## 2019-11-30 NOTE — Therapy (Signed)
[facility] [address] [place], [place], [address] Phone: [phone]   Fax:  [phone]  Physical Therapy Evaluation  Patient Details  Name: Kathryn Collier MRN: [id number] Date of Birth: [birth year] Referring Provider (PT): Dr. Junius Roads    Encounter Date: 11/30/2019  PT End of Session - 11/30/19 1427    Visit Number  1    Date for PT Re-Evaluation  01/25/20    Authorization Type  30 visit limit    PT Start Time  1015    PT Stop Time  1102    PT Time Calculation (min)  47 min    Activity Tolerance  Patient tolerated treatment well       Past Medical History:  Diagnosis Date  . Connective tissue disease ([facility])   . 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 Hashimoto's disease   . Systemic lupus ([place])     No past surgical history on file.  There were no vitals filed for this visit.   Subjective Assessment - 11/30/19 1019    Subjective  The shoulder pain has been present since 30 years ago but has worsened in the last several months.  Has seen [facility] in the past but now out of network. Did stabilization ex's for neck and dry needling.   Pops and cracks on left side upper scapula and left neck.  Used to do band ex's but not recently since thyroid issue which has caused extreme exhaustion.    Pertinent History  systemic lupus;  had DN and UV laser in the past;  sensitive to fluorescent lights;  low energy due  to hypothyroid;  myasthenia gravis just the eye;  used to be very active/athletic; has had jaw pain;  hypermobility;  get acupuncture weekly and cupping; bad response to oral steroids suicidal thoughts    Limitations  House hold activities    Diagnostic tests  injection in late December numbing agent; mild scoliosis    Patient Stated Goals  wish we could find out why structurely this is happening; pain relief    Currently in Pain?  No/denies    Pain Score  0-No pain    Pain Location   Scapula    Pain Orientation  Left    Aggravating Factors   winter time, cold, drive with hands on wheel, reading in bed, holding phone against shoulder, stress    Pain Relieving Factors  heat; topical stuff; CBD cream but unsure if it helps; Biofreeze; massage weekly         [facility] PT Assessment - 11/30/19 0001      Assessment   Medical Diagnosis  chronic left shoulder pain     Referring Provider (PT)  Dr. Junius Roads     Onset Date/Surgical Date  --   > 6 months   Hand Dominance  Right    Next MD Visit  as needed    Prior Therapy  with [facility] previously 2019      Precautions   Precautions  None      Restrictions   Weight Bearing Restrictions  No      Balance Screen   Has the patient fallen in the past 6 months  No    Has the patient had a decrease in activity level because of a fear of falling?   No    Is the patient reluctant to leave their home because of a fear of falling?   No  Home Environment   Living Environment  Private residence    Living Arrangements  Spouse/significant other      Prior Function   Level of Independence  Independent    Vocation  Unemployed    Vocation Requirements  previously a Scientist, research (medical); book clubs, used to do volunteer work       Observation/Other Assessments   Focus on Therapeutic Outcomes (FOTO)   35% limitation       Posture/Postural Control   Posture/Postural Control  --   good sitting posture   Posture Comments  left scapular elevation at rest and with UE flexion;  no scoliosis noted;  mild left scapular winging with wall push up       AROM   Right Shoulder Flexion  155 Degrees    Right Shoulder ABduction  168 Degrees    Right Shoulder Internal Rotation  --   T10   Right Shoulder External Rotation  --   T1   Left Shoulder Flexion  150 Degrees    Left Shoulder ABduction  162 Degrees    Left Shoulder Internal Rotation  --   T6   Left Shoulder External Rotation  --   T1   Cervical Flexion  28     Cervical Extension  44    Cervical - Right Side Bend  40    Cervical - Left Side Bend  28    Cervical - Right Rotation  50    Cervical - Left Rotation  46      Strength   Left Shoulder Flexion  4+/5    Left Shoulder Extension  4+/5    Left Shoulder Horizontal ABduction  4/5    Left Shoulder Horizontal ADduction  4/5    Cervical Flexion  4/5    Cervical Extension  4/5      Palpation   Palpation comment  tender points in bil upper traps, left rhomboid, left cervical paraspinals                Objective measurements completed on examination: See above findings.              PT Education - 11/30/19 1107    Education Details  patient has a folder of previously given HEP;  discussed initiating band rows and shoulder extensions 10x 1x/day    Person(s) Educated  Patient    Methods  Explanation;Demonstration;Handout    Comprehension  Returned demonstration;Verbalized understanding       PT Short Term Goals - 11/30/19 1440      PT SHORT TERM GOAL #1   Title  Pt will be independent with initial HEP    Time  4    Period  Weeks    Status  New    Target Date  12/28/19      PT SHORT TERM GOAL #2   Title  Pt will have decrease in scapular pain while driving and during ADLS byt 19%    Time  4    Period  Weeks    Status  New      PT SHORT TERM GOAL #3   Title  Improved shoulder left shoulder flexion to 155 degrees and abduction to 165 degrees needed for reaching overhead    Time  4    Period  Weeks    Status  New        PT Long Term Goals - 11/30/19 1704      PT  LONG TERM GOAL #1   Title  Pt will be independent with advanced HEP for shoulder and cervical stabilization and ROM maintenance    Time  8    Period  Weeks    Status  New    Target Date  01/25/20      PT LONG TERM GOAL #2   Title  Pt will increase AROM of neck to cervical flexion to 35 degrees, extension to 50 degrees, left sidebending to 35 degrees and left rotation to 50 degrees in order to  drive with less discomfort    Time  8    Period  Weeks    Status  New      PT LONG TERM GOAL #3   Title  Improve FOTO from35% to at least 29% for maximal functional mobility    Time  8    Period  Weeks    Status  New      PT LONG TERM GOAL #4   Title  Left scapular and cervical strength grossly 4/5 to 4+/5 needed for lifting, pushing, pulling with greater ease    Time  8    Period  Weeks    Status  New      PT LONG TERM GOAL #5   Title  Patient will report an overall 50% improvement in pain with driving and other ADLS for improved quality of life    Time  8    Period  Weeks    Status  New             Plan - 11/30/19 1428    Clinical Impression Statement  The patient report a very long history of left scapula and neck pain which has worsened recently.  She has had success in the past with PT with dry needling and exercise although she has stopped exercising in the last few months as she has dealt with a thyroid condition and lupus.  Her scapular pain is worsened with cold, stress, driving, reading in bed and holding the phone between her shoulder and ear.   She has left scapular elevation at rest and with UE flexion and mild scapular winging.  Although she reports general hypermobility over the years, her cervical ROM is limited with flexion, extension, left sidebending and left rotation.  Mild ROM limitations with left shoulder flexion and abduction.  Shoulder strength grossly 4+/5.  Cervical deep flexors and extensors 4/5 and periscapular muscles 4/5.  Tender points and taut bands indentified in cervical paraspinals, upper traps and rhomboids.  She would benefit from PT to address these deficits.    Personal Factors and Comorbidities  Past/Current Experience;Comorbidity 1;Comorbidity 2;Comorbidity 3+;Time since onset of injury/illness/exacerbation    Comorbidities  lupus; myasthenia gravis; hypothyroidism; initial onset 30 years ago    Examination-Activity Limitations  Reach  Overhead;Carry;Lift    Examination-Participation Restrictions  Cleaning;Community Activity;Driving;Other;Volunteer    Stability/Clinical Decision Making  Evolving/Moderate complexity    Clinical Decision Making  Moderate    Rehab Potential  Good    PT Frequency  2x / week    PT Duration  8 weeks    PT Treatment/Interventions  ADLs/Self Care Home Management;Cryotherapy;Electrical Stimulation;Moist Heat;Traction;Ultrasound;Neuromuscular re-education;Therapeutic exercise;Therapeutic activities;Patient/family education;Manual techniques;Dry needling;Taping    PT Next Visit Plan  Dry needling;  initiate HEP with emphasis on cervical stabilization and periscapular strengthening; heat/ES as needed    Consulted and Agree with Plan of Care  Patient       Patient will benefit from skilled therapeutic intervention  in order to improve the following deficits and impairments:  Decreased range of motion, Increased fascial restricitons, Increased muscle spasms, Impaired UE functional use, Decreased activity tolerance, Pain, Decreased strength  Visit Diagnosis: Chronic left shoulder pain - Plan: PT plan of care cert/re-cert  Cervicalgia - Plan: PT plan of care cert/re-cert  Muscle weakness (generalized) - Plan: PT plan of care cert/re-cert  Stiffness of left shoulder, not elsewhere classified - Plan: PT plan of care cert/re-cert     Problem List Patient Active Problem List   Diagnosis Date Noted  . Asthma 11/08/2019  . Wellness examination 11/08/2019  . Myasthenia gravis ([facility]) 12/23/2017  . Hashimoto's disease 12/06/2016  . Vitamin D deficiency 05/31/2016  . Systemic lupus erythematosus ([facility]) 10/26/2015  . Hypothyroidism 10/26/2015   Ruben Im, PT 11/30/19 5:13 PM Phone: [phone] Fax: [phone] Alvera Singh 11/30/2019, 5:12 PM  [facility] [address] [place], [place], [address] Phone: [phone]   Fax:   [phone]  Name: Kathryn Collier MRN: [id number] Date of Birth: [birth year]

## 2019-12-06 ENCOUNTER — Other Ambulatory Visit: Payer: Self-pay

## 2019-12-06 ENCOUNTER — Ambulatory Visit: Payer: 59 | Attending: Family Medicine

## 2019-12-06 DIAGNOSIS — M25612 Stiffness of left shoulder, not elsewhere classified: Secondary | ICD-10-CM | POA: Insufficient documentation

## 2019-12-06 DIAGNOSIS — M25512 Pain in left shoulder: Secondary | ICD-10-CM | POA: Insufficient documentation

## 2019-12-06 DIAGNOSIS — R252 Cramp and spasm: Secondary | ICD-10-CM | POA: Diagnosis present

## 2019-12-06 DIAGNOSIS — M6281 Muscle weakness (generalized): Secondary | ICD-10-CM | POA: Insufficient documentation

## 2019-12-06 DIAGNOSIS — G8929 Other chronic pain: Secondary | ICD-10-CM | POA: Insufficient documentation

## 2019-12-06 DIAGNOSIS — M542 Cervicalgia: Secondary | ICD-10-CM

## 2019-12-06 NOTE — Patient Instructions (Addendum)
Cervico-Thoracic: Extension / Rotation (Sitting)    Reach across body with left arm and grasp back of chair. Gently look over right side shoulder. Hold __20__ seconds. Relax. Repeat __3__ times per set. Do __1__ sets per session. Do _20___ sessions per day.  [email or web address].[email or web address]   Copyright  VHI. All rights reserved.  Access Code: [email or web address]  URL: [email or web address]  Date: 12/06/2019  Prepared by: Sigurd Sos   Exercises Cat-Camel - 10 reps - 1 sets - 5 hold - 2x daily - 7x weekly Child's Pose Stretch - 10 reps - 3 sets - 1x daily - 7x weekly Child's Pose with Sidebending - 10 reps - 3 sets - 1x daily - 7x weekly Trigger Point Dry Needling  . What is Trigger Point Dry Needling (DN)? o DN is a physical therapy technique used to treat muscle pain and dysfunction. Specifically, DN helps deactivate muscle trigger points (muscle knots).  o A thin filiform needle is used to penetrate the skin and stimulate the underlying trigger point. The goal is for a local twitch response (LTR) to occur and for the trigger point to relax. No medication of any kind is injected during the procedure.   . What Does Trigger Point Dry Needling Feel Like?  o The procedure feels different for each individual patient. Some patients report that they do not actually feel the needle enter the skin and overall the process is not painful. Very mild bleeding may occur. However, many patients feel a deep cramping in the muscle in which the needle was inserted. This is the local twitch response.   Marland Kitchen How Will I feel after the treatment? o Soreness is normal, and the onset of soreness may not occur for a few hours. Typically this soreness does not last longer than two days.  o Bruising is uncommon, however; ice can be used to decrease any possible bruising.  o In rare cases feeling tired or nauseous after the treatment is normal. In addition, your symptoms may get worse before they get better, this  period will typically not last longer than 24 hours.   . What Can I do After My Treatment? o Increase your hydration by drinking more water for the next 24 hours. o You may place ice or heat on the areas treated that have become sore, however, do not use heat on inflamed or bruised areas. Heat often brings more relief post needling. o You can continue your regular activities, but vigorous activity is not recommended initially after the treatment for 24 hours. o DN is best combined with other physical therapy such as strengthening, stretching, and other therapies.    [facility] [address], [place], [place] [address] Phone # [phone] Fax [phone] [facility] [address], [place],  [address] Phone # [phone] Fax [phone]

## 2019-12-06 NOTE — Therapy (Signed)
[facility] [address] [place], [place], [address] Phone: [phone]   Fax:  [phone]  Physical Therapy Treatment  Patient Details  Name: Kathryn Collier MRN: [id number] Date of Birth: January 04, 1971 Referring Provider (PT): Dr. Junius Roads    Encounter Date: 12/06/2019  PT End of Session - 12/06/19 1102    Visit Number  2    Date for PT Re-Evaluation  01/25/20    Authorization Type  30 visit limit    Authorization - Visit Number  2    Authorization - Number of Visits  30    PT Start Time  1016    PT Stop Time  1108    PT Time Calculation (min)  52 min    Activity Tolerance  Patient tolerated treatment well    Behavior During Therapy  [facility] for tasks assessed/performed       Past Medical History:  Diagnosis Date  . Connective tissue disease ([place])   . Connective tissue disease ([place])    undifferentiated  . Hashimoto's disease   . Systemic lupus ([place])     History reviewed. No pertinent surgical history.  There were no vitals filed for this visit.  Subjective Assessment - 12/06/19 1012    Subjective  My neck is tight.    Patient Stated Goals  wish we could find out why structurely this is happening; pain relief    Currently in Pain?  Yes    Pain Score  1    stiffness now   Pain Location  Scapula    Pain Orientation  Left    Pain Descriptors / Indicators  Aching    Pain Type  Chronic pain    Pain Onset  More than a month ago    Pain Frequency  Constant    Aggravating Factors   winter time, cold, reading in bed, hodling phone against shoulder, stress    Pain Relieving Factors  heat, topical stuff, CBD                       [facility] Adult PT Treatment/Exercise - 12/06/19 0001      Transfers   Comments  PT demo of cat/camel and prayer stretch      Exercises   Exercises  Shoulder      Shoulder Exercises: Seated   Other Seated Exercises  cervicothoracic rotation 3x20 seconds      Modalities    Modalities  Moist Heat      Moist Heat Therapy   Number Minutes Moist Heat  10 Minutes    Moist Heat Location  Cervical   thoracic     Manual Therapy   Manual Therapy  Myofascial release;Soft tissue mobilization    Manual therapy comments  Lt rhomboids, thoracic spine       Trigger Point Dry Needling - 12/06/19 0001    Consent Given?  Yes    Education Handout Provided  Yes    Muscles Treated Head and Neck  Upper trapezius;Cervical multifidi    Muscles Treated Upper Quadrant  Rhomboids   Lt   Other Dry Needling  thoracic multifidi T3-5    Upper Trapezius Response  Twitch reponse elicited;Palpable increased muscle length    Cervical multifidi Response  Twitch reponse elicited;Palpable increased muscle length    Rhomboids Response  Twitch response elicited;Palpable increased muscle length           PT Education - 12/06/19 1027    Education Details  Access Code: [email or web address]    Person(s) Educated  Patient    Methods  Explanation;Demonstration;Handout    Comprehension  Verbalized understanding       PT Short Term Goals - 11/30/19 1440      PT SHORT TERM GOAL #1   Title  Pt will be independent with initial HEP    Time  4    Period  Weeks    Status  New    Target Date  12/28/19      PT SHORT TERM GOAL #2   Title  Pt will have decrease in scapular pain while driving and during ADLS byt 35%    Time  4    Period  Weeks    Status  New      PT SHORT TERM GOAL #3   Title  Improved shoulder left shoulder flexion to 155 degrees and abduction to 165 degrees needed for reaching overhead    Time  4    Period  Weeks    Status  New        PT Long Term Goals - 11/30/19 1704      PT LONG TERM GOAL #1   Title  Pt will be independent with advanced HEP for shoulder and cervical stabilization and ROM maintenance    Time  8    Period  Weeks    Status  New    Target Date  01/25/20      PT LONG TERM GOAL #2   Title  Pt will increase AROM of neck to cervical flexion to 35  degrees, extension to 50 degrees, left sidebending to 35 degrees and left rotation to 50 degrees in order to drive with less discomfort    Time  8    Period  Weeks    Status  New      PT LONG TERM GOAL #3   Title  Improve FOTO from35% to at least 29% for maximal functional mobility    Time  8    Period  Weeks    Status  New      PT LONG TERM GOAL #4   Title  Left scapular and cervical strength grossly 4/5 to 4+/5 needed for lifting, pushing, pulling with greater ease    Time  8    Period  Weeks    Status  New      PT LONG TERM GOAL #5   Title  Patient will report an overall 50% improvement in pain with driving and other ADLS for improved quality of life    Time  8    Period  Weeks    Status  New            Plan - 12/06/19 1101    Clinical Impression Statement  Pt with first time follow-up after evaluation.  Pt has tried to do her theraband exercises for shoulder extension and rowing and is gradually increasing reps.  PT issued HEP for thoracic mobility with demo and handouts provided.  Session focused on manual therapy to address Lt rhomboid, thoracic, upper trap and cervical mobility to address chronic pain and stiffness.  Pt demonstrated improved tissue mobility on the Lt after manual therapy.  Pt will continue to benefit from skilled PT to address postural strength, flexibility and manual for tissue mobility.    PT Treatment/Interventions  ADLs/Self Care Home Management;Cryotherapy;Electrical Stimulation;Moist Heat;Traction;Ultrasound;Neuromuscular re-education;Therapeutic exercise;Therapeutic activities;Patient/family education;Manual techniques;Dry needling;Taping    PT Next Visit Plan  assess response to try needling,  review HEP for thoracic mobility and continue to advance thoracic and cervical stabilization    PT Home Exercise Plan  Access Code: [email or web address]    Consulted and Agree with Plan of Care  Patient       Patient will benefit from skilled therapeutic intervention  in order to improve the following deficits and impairments:  Decreased range of motion, Increased fascial restricitons, Increased muscle spasms, Impaired UE functional use, Decreased activity tolerance, Pain, Decreased strength  Visit Diagnosis: Cervicalgia  Chronic left shoulder pain  Muscle weakness (generalized)  Stiffness of left shoulder, not elsewhere classified     Problem List Patient Active Problem List   Diagnosis Date Noted  . Asthma 11/08/2019  . Wellness examination 11/08/2019  . Myasthenia gravis ([place]) 12/23/2017  . Hashimoto's disease 12/06/2016  . Vitamin D deficiency 05/31/2016  . Systemic lupus erythematosus ([place]) 10/26/2015  . Hypothyroidism 10/26/2015     Sigurd Sos, PT 12/06/19 11:05 AM  [facility] [address], [place], [place], [address] Phone: [phone]   Fax:  [phone]  Name: LAVILLA DELAMORA MRN: [id number] Date of Birth: October 25, 1971

## 2019-12-09 ENCOUNTER — Other Ambulatory Visit: Payer: Self-pay

## 2019-12-09 ENCOUNTER — Ambulatory Visit: Payer: 59

## 2019-12-09 ENCOUNTER — Encounter: Payer: Self-pay | Admitting: Family Medicine

## 2019-12-09 DIAGNOSIS — G8929 Other chronic pain: Secondary | ICD-10-CM

## 2019-12-09 DIAGNOSIS — M25512 Pain in left shoulder: Secondary | ICD-10-CM

## 2019-12-09 DIAGNOSIS — M542 Cervicalgia: Secondary | ICD-10-CM

## 2019-12-09 DIAGNOSIS — M6281 Muscle weakness (generalized): Secondary | ICD-10-CM

## 2019-12-09 DIAGNOSIS — M25612 Stiffness of left shoulder, not elsewhere classified: Secondary | ICD-10-CM

## 2019-12-09 NOTE — Therapy (Signed)
[facility] [address], [place], [place], [address] Phone: [phone]   Fax:  [phone]  Physical Therapy Treatment  Patient Details  Name: Kathryn Collier MRN: [id number] Date of Birth: 11/20/1970 Referring Provider (PT): Dr. Junius Roads    Encounter Date: 12/09/2019  PT End of Session - 12/09/19 1233    Visit Number  3    Date for PT Re-Evaluation  01/25/20    Authorization Type  30 visit limit    Authorization - Visit Number  3    Authorization - Number of Visits  30    PT Start Time  4128   late   PT Stop Time  1233    PT Time Calculation (min)  40 min    Activity Tolerance  Patient tolerated treatment well    Behavior During Therapy  [facility] for tasks assessed/performed       Past Medical History:  Diagnosis Date  . Connective tissue disease ([place])   . 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 Hashimoto's disease   . Systemic lupus ([place])     History reviewed. No pertinent surgical history.  There were no vitals filed for this visit.  Subjective Assessment - 12/09/19 1155    Subjective  I had a bit of reaction after dry needling.  The area you treated feels good and now Lt pec and neck is tight.    Currently in Pain?  Yes    Pain Score  2                        [facility] Adult PT Treatment/Exercise - 12/09/19 0001      Manual Therapy   Manual Therapy  Myofascial release;Soft tissue mobilization    Manual therapy comments  Lt pec, upper trap and cervical paraspinals       Trigger Point Dry Needling - 12/09/19 0001    Consent Given?  Yes    Muscles Treated Head and Neck  Upper trapezius;Suboccipitals   Lt side only   Muscles Treated Upper Quadrant  Pectoralis major   LT   Other Dry Needling  cervical paraspinals    Lt only   Upper Trapezius Response  Twitch reponse elicited;Palpable increased muscle length    Pectoralis Major Response  Twitch response elicited;Palpable  increased muscle length             PT Short Term Goals - 11/30/19 1440      PT SHORT TERM GOAL #1   Title  Pt will be independent with initial HEP    Time  4    Period  Weeks    Status  New    Target Date  12/28/19      PT SHORT TERM GOAL #2   Title  Pt will have decrease in scapular pain while driving and during ADLS byt 78%    Time  4    Period  Weeks    Status  New      PT SHORT TERM GOAL #3   Title  Improved shoulder left shoulder flexion to 155 degrees and abduction to 165 degrees needed for reaching overhead    Time  4    Period  Weeks    Status  New        PT Long Term Goals - 11/30/19 1704      PT LONG TERM GOAL #1   Title  Pt will be independent with  advanced HEP for shoulder and cervical stabilization and ROM maintenance    Time  8    Period  Weeks    Status  New    Target Date  01/25/20      PT LONG TERM GOAL #2   Title  Pt will increase AROM of neck to cervical flexion to 35 degrees, extension to 50 degrees, left sidebending to 35 degrees and left rotation to 50 degrees in order to drive with less discomfort    Time  8    Period  Weeks    Status  New      PT LONG TERM GOAL #3   Title  Improve FOTO from35% to at least 29% for maximal functional mobility    Time  8    Period  Weeks    Status  New      PT LONG TERM GOAL #4   Title  Left scapular and cervical strength grossly 4/5 to 4+/5 needed for lifting, pushing, pulling with greater ease    Time  8    Period  Weeks    Status  New      PT LONG TERM GOAL #5   Title  Patient will report an overall 50% improvement in pain with driving and other ADLS for improved quality of life    Time  8    Period  Weeks    Status  New            Plan - 12/09/19 1241    Clinical Impression Statement  Pt reports Lt anterior chest/pec pain after last session and increased Lt neck pain.  Pt with tension and trigger points in Lt pec and neck and session focused on dry needling and manual therapy to  address this tension.  Pt demonstrated improved tissue mobility and reduced report of stiffness after manual therapy today.  Pt will continue to perform flexibility and gentle strength exercises at home.  Pt will continue to benefit from skilled PT to address muscle tension, flexibility and functional strength deficits.    PT Frequency  2x / week    PT Treatment/Interventions  ADLs/Self Care Home Management;Cryotherapy;Electrical Stimulation;Moist Heat;Traction;Ultrasound;Neuromuscular re-education;Therapeutic exercise;Therapeutic activities;Patient/family education;Manual techniques;Dry needling;Taping    PT Next Visit Plan  assess response to dry needling.  continue manual therapy and HEP advancement as able.    PT Home Exercise Plan  Access Code: [email or web address]    Consulted and Agree with Plan of Care  Patient       Patient will benefit from skilled therapeutic intervention in order to improve the following deficits and impairments:  Decreased range of motion, Increased fascial restricitons, Increased muscle spasms, Impaired UE functional use, Decreased activity tolerance, Pain, Decreased strength  Visit Diagnosis: Cervicalgia  Chronic left shoulder pain  Muscle weakness (generalized)  Stiffness of left shoulder, not elsewhere classified     Problem List Patient Active Problem List   Diagnosis Date Noted  . Asthma 11/08/2019  . Wellness examination 11/08/2019  . Myasthenia gravis ([place]) 12/23/2017  . Hashimoto's disease 12/06/2016  . Vitamin D deficiency 05/31/2016  . Systemic lupus erythematosus ([place]) 10/26/2015  . Hypothyroidism 10/26/2015   Sigurd Sos, PT 12/09/19 12:44 PM  [facility] [address] [place], [place], [address] Phone: [phone]   Fax:  [phone]  Name: Kathryn Collier MRN: [id number] Date of Birth: 1970/11/05

## 2019-12-14 ENCOUNTER — Ambulatory Visit: Payer: 59

## 2019-12-14 ENCOUNTER — Other Ambulatory Visit: Payer: Self-pay

## 2019-12-14 DIAGNOSIS — M542 Cervicalgia: Secondary | ICD-10-CM

## 2019-12-14 DIAGNOSIS — R252 Cramp and spasm: Secondary | ICD-10-CM

## 2019-12-14 DIAGNOSIS — M25612 Stiffness of left shoulder, not elsewhere classified: Secondary | ICD-10-CM

## 2019-12-14 DIAGNOSIS — G8929 Other chronic pain: Secondary | ICD-10-CM

## 2019-12-14 DIAGNOSIS — M25512 Pain in left shoulder: Secondary | ICD-10-CM

## 2019-12-14 DIAGNOSIS — M6281 Muscle weakness (generalized): Secondary | ICD-10-CM

## 2019-12-14 NOTE — Therapy (Signed)
[facility] [address] [place], [place], [address] Phone: [phone]   Fax:  [phone]  Physical Therapy Treatment  Patient Details  Name: Kathryn Collier MRN: [id number] Date of Birth: April 08, 1971 Referring Provider (PT): Dr. Junius Roads    Encounter Date: 12/14/2019  PT End of Session - 12/14/19 1139    Visit Number  4    Date for PT Re-Evaluation  01/25/20    Authorization Type  30 visit limit    Authorization - Visit Number  4    Authorization - Number of Visits  30    PT Start Time  1021    PT Stop Time  1101    PT Time Calculation (min)  40 min    Activity Tolerance  Patient tolerated treatment well    Behavior During Therapy  [facility] for tasks assessed/performed       Past Medical History:  Diagnosis Date  . Connective tissue disease ([place])   . 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 Hashimoto's disease   . Systemic lupus ([place])     No past surgical history on file.  There were no vitals filed for this visit.  Subjective Assessment - 12/14/19 1025    Subjective  The process of the needling interrupts my sleep and some pain but I know it will help me.  Pt has a new order for her neck.    Pertinent History  systemic lupus;  had DN and UV laser in the past;  sensitive to fluorescent lights;  low energy to hypothyroid;  myasthenia gravis just the eye;  used to be very active/athletic; has had jaw pain;  hypermobility;  get acupuncture weekly and cupping; bad response to oral steroids suicidal thoughts    Currently in Pain?  Yes    Pain Location  Thoracic    Pain Orientation  Left    Pain Descriptors / Indicators  Aching    Pain Type  Chronic pain    Pain Onset  More than a month ago    Pain Frequency  Constant    Aggravating Factors   cold, reading in bed, holding phone against the shoulder, stress    Pain Relieving Factors  heat, topical rub, CBD         [facility] PT Assessment - 12/14/19 0001       Assessment   Medical Diagnosis  chronic left shoulder pain, Neck pain    Referring Provider (PT)  Dr. Junius Roads     Onset Date/Surgical Date  --   chronic    Hand Dominance  Right      Observation/Other Assessments   Focus on Therapeutic Outcomes (FOTO)   35% limitation    for neck     Posture/Postural Control   Posture/Postural Control  Postural limitations      AROM   Cervical Flexion  34    Cervical Extension  44    Cervical - Right Side Bend  40    Cervical - Left Side Bend  38    Cervical - Right Rotation  70    Cervical - Left Rotation  60                   [facility] Adult PT Treatment/Exercise - 12/14/19 0001      Shoulder Exercises: Standing   Extension  Strengthening;Both;20 reps    Row  Strengthening;Both;20 reps      Manual Therapy   Manual Therapy  Myofascial release;Soft tissue mobilization    Manual therapy comments  bil upper traps, bil neck and suboccipitals       Trigger Point Dry Needling - 12/14/19 0001    Consent Given?  Yes    Muscles Treated Head and Neck  Upper trapezius;Suboccipitals;Cervical multifidi    Other Dry Needling  cervical paraspinals    Lt only   Upper Trapezius Response  Twitch reponse elicited;Palpable increased muscle length    Suboccipitals Response  Twitch response elicited;Palpable increased muscle length    Cervical multifidi Response  Twitch reponse elicited;Palpable increased muscle length    Pectoralis Major Response  Twitch response elicited;Palpable increased muscle length           PT Education - 12/14/19 1042    Education Details  Access Code: [email or web address]    Person(s) Educated  Patient    Methods  Explanation;Demonstration;Handout    Comprehension  Verbalized understanding;Returned demonstration       PT Short Term Goals - 11/30/19 1440      PT SHORT TERM GOAL #1   Title  Pt will be independent with initial HEP    Time  4    Period  Weeks    Status  New    Target Date  12/28/19      PT SHORT  TERM GOAL #2   Title  Pt will have decrease in scapular pain while driving and during ADLS byt 28%    Time  4    Period  Weeks    Status  New      PT SHORT TERM GOAL #3   Title  Improved shoulder left shoulder flexion to 155 degrees and abduction to 165 degrees needed for reaching overhead    Time  4    Period  Weeks    Status  New        PT Long Term Goals - 12/14/19 1027      PT LONG TERM GOAL #1   Title  Pt will be independent with advanced HEP for shoulder and cervical stabilization and ROM maintenance    Time  8    Period  Weeks    Status  On-going    Target Date  01/25/20      PT LONG TERM GOAL #2   Title  Pt will increase AROM of neck to cervical flexion to 35 degrees, extension to 50 degrees, left sidebending to 35 degrees and left rotation to 50 degrees in order to drive with less discomfort    Time  8    Period  Weeks    Status  On-going    Target Date  01/25/20      PT LONG TERM GOAL #3   Title  Improve FOTO from35% to at least 29% for maximal functional mobility    Time  8    Period  Weeks    Status  On-going    Target Date  01/25/20      PT LONG TERM GOAL #4   Title  Left scapular and cervical strength grossly 4/5 to 4+/5 needed for lifting, pushing, pulling with greater ease    Time  8    Period  Weeks    Status  On-going    Target Date  01/25/20      PT LONG TERM GOAL #5   Title  Patient will report an overall 50% improvement in neck and Lt shoulder pain with driving and other ADLS for improved quality of life  Time  8    Period  Weeks    Status  On-going    Target Date  01/25/20            Plan - 12/14/19 1039    Clinical Impression Statement  Pt has new order for neck pain.  Neck was assessed today and new goals set.  The patient reports a long history of left scapula and neck pain which has worsened recently.  She has had success in the past with PT with dry needling and exercise although she has stopped exercising in the last few months  as she has dealt with a thyroid condition and lupus.  PT has initiated dry needling and return to some scapular strength exercises.  PT reviewed HEP for strength and issued yellow band today.  PT issued gentle cervical flexibility exercises today.  Pt with tension in neck and thoracic spine and demonstrated improved tissue mobility after needling today.  Pt will continue to benefit from skilled PT to address chronic neck and shoulder pain.    PT Frequency  2x / week    PT Duration  8 weeks    PT Treatment/Interventions  ADLs/Self Care Home Management;Cryotherapy;Electrical Stimulation;Moist Heat;Traction;Ultrasound;Neuromuscular re-education;Therapeutic exercise;Therapeutic activities;Patient/family education;Manual techniques;Dry needling;Taping    PT Next Visit Plan  assess response to dry needling.  continue manual therapy and HEP advancement as able.    PT Home Exercise Plan  Access Code: [email or web address]    Consulted and Agree with Plan of Care  Patient       Patient will benefit from skilled therapeutic intervention in order to improve the following deficits and impairments:  Decreased range of motion, Increased fascial restricitons, Increased muscle spasms, Impaired UE functional use, Decreased activity tolerance, Pain, Decreased strength  Visit Diagnosis: Cervicalgia  Chronic left shoulder pain  Muscle weakness (generalized)  Stiffness of left shoulder, not elsewhere classified     Problem List Patient Active Problem List   Diagnosis Date Noted  . Asthma 11/08/2019  . Wellness examination 11/08/2019  . Myasthenia gravis ([place]) 12/23/2017  . Hashimoto's disease 12/06/2016  . Vitamin D deficiency 05/31/2016  . Systemic lupus erythematosus ([place]) 10/26/2015  . Hypothyroidism 10/26/2015     Sigurd Sos, PT 12/14/19 11:40 AM   [facility] [address] [place], [place], [address] Phone: [phone]   Fax:   [phone]  Name: Kathryn Collier MRN: [id number] Date of Birth: 1971-10-29

## 2019-12-14 NOTE — Patient Instructions (Signed)
Access Code: [email or web address]  URL: [email or web address]  Date: 12/14/2019  Prepared by: Sigurd Sos   Exercises Cat-Camel - 10 reps - 1 sets - 5 hold - 2x daily - 7x weekly Child's Pose Stretch - 10 reps - 3 sets - 1x daily - 7x weekly Child's Pose with Sidebending - 10 reps - 3 sets - 1x daily - 7x weekly Seated Cervical Flexion AROM - 3 reps - 1 sets - 20 hold - 3x daily                            - 7x weekly Seated Cervical Sidebending AROM - 3 reps - 1 sets - 20 hold - 3x daily - 7x weekly Seated Cervical Rotation AROM - 3 reps - 1 sets - 20 hold - 3x daily - 7x weekly Seated Correct Posture - 10 reps - 3 sets - 1x daily - 7x weekly

## 2019-12-21 ENCOUNTER — Other Ambulatory Visit: Payer: Self-pay

## 2019-12-21 ENCOUNTER — Ambulatory Visit: Payer: 59

## 2019-12-21 DIAGNOSIS — R252 Cramp and spasm: Secondary | ICD-10-CM

## 2019-12-21 DIAGNOSIS — M6281 Muscle weakness (generalized): Secondary | ICD-10-CM

## 2019-12-21 DIAGNOSIS — G8929 Other chronic pain: Secondary | ICD-10-CM

## 2019-12-21 DIAGNOSIS — M25512 Pain in left shoulder: Secondary | ICD-10-CM

## 2019-12-21 DIAGNOSIS — M542 Cervicalgia: Secondary | ICD-10-CM

## 2019-12-21 DIAGNOSIS — M25612 Stiffness of left shoulder, not elsewhere classified: Secondary | ICD-10-CM

## 2019-12-21 NOTE — Therapy (Signed)
[facility] [address] [place], [place], [address] Phone: [phone]   Fax:  [phone]  Physical Therapy Treatment  Patient Details  Name: Kathryn Collier MRN: [id number] Date of Birth: 12/28/1970 Referring Provider (PT): Dr. Junius Roads    Encounter Date: 12/21/2019  PT End of Session - 12/21/19 1122    Visit Number  5    Date for PT Re-Evaluation  01/25/20    Authorization Type  30 visit limit    Authorization - Visit Number  5    Authorization - Number of Visits  30    PT Start Time  6546    PT Stop Time  1118    PT Time Calculation (min)  60 min    Activity Tolerance  Patient tolerated treatment well    Behavior During Therapy  [facility] for tasks assessed/performed       Past Medical History:  Diagnosis Date  . Connective tissue disease ([place])   . 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 Hashimoto's disease   . Systemic lupus ([place])     History reviewed. No pertinent surgical history.  There were no vitals filed for this visit.  Subjective Assessment - 12/21/19 1020    Subjective  Things are OK.  The recovery is hard for me after needling.  How can we back it down?    Currently in Pain?  Yes    Pain Location  Thoracic    Pain Orientation  Left    Pain Descriptors / Indicators  Aching    Pain Type  Chronic pain    Pain Onset  More than a month ago    Pain Frequency  Constant    Aggravating Factors   cold, sleep, stress    Pain Relieving Factors  heat, topical rub, CBD                       [facility] Adult PT Treatment/Exercise - 12/21/19 0001      Shoulder Exercises: Standing   Extension  Strengthening;Both;20 reps    Extension Limitations  no band- pt not able to perform with smooth technique-"catch" in Lt scapula     Row  Strengthening;Both;20 reps    Row Limitations  no band due to scapular spasm      Moist Heat Therapy   Number Minutes Moist Heat  12 Minutes    Moist  Heat Location  Cervical      Manual Therapy   Manual Therapy  Myofascial release;Soft tissue mobilization    Manual therapy comments  bil upper traps, bil neck, Lt upper trap and levator       Trigger Point Dry Needling - 12/21/19 0001    Consent Given?  Yes    Muscles Treated Head and Neck  Upper trapezius;Levator scapulae   Lt only   Muscles Treated Upper Quadrant  Subscapularis   Lt only   Upper Trapezius Response  Twitch reponse elicited;Palpable increased muscle length    Suboccipitals Response  Twitch response elicited;Palpable increased muscle length    Subscapularis Response  Palpable increased muscle length;Twitch response elicited           PT Education - 12/21/19 1121    Education Details  how to create a circuit with exercises at home.  Modifications to rows and shoulder extension- tactile cues provided    Person(s) Educated  Patient    Methods  Explanation    Comprehension  Verbalized  understanding       PT Short Term Goals - 11/30/19 1440      PT SHORT TERM GOAL #1   Title  Pt will be independent with initial HEP    Time  4    Period  Weeks    Status  New    Target Date  12/28/19      PT SHORT TERM GOAL #2   Title  Pt will have decrease in scapular pain while driving and during ADLS byt 62%    Time  4    Period  Weeks    Status  New      PT SHORT TERM GOAL #3   Title  Improved shoulder left shoulder flexion to 155 degrees and abduction to 165 degrees needed for reaching overhead    Time  4    Period  Weeks    Status  New        PT Long Term Goals - 12/14/19 1027      PT LONG TERM GOAL #1   Title  Pt will be independent with advanced HEP for shoulder and cervical stabilization and ROM maintenance    Time  8    Period  Weeks    Status  On-going    Target Date  01/25/20      PT LONG TERM GOAL #2   Title  Pt will increase AROM of neck to cervical flexion to 35 degrees, extension to 50 degrees, left sidebending to 35 degrees and left rotation  to 50 degrees in order to drive with less discomfort    Time  8    Period  Weeks    Status  On-going    Target Date  01/25/20      PT LONG TERM GOAL #3   Title  Improve FOTO from35% to at least 29% for maximal functional mobility    Time  8    Period  Weeks    Status  On-going    Target Date  01/25/20      PT LONG TERM GOAL #4   Title  Left scapular and cervical strength grossly 4/5 to 4+/5 needed for lifting, pushing, pulling with greater ease    Time  8    Period  Weeks    Status  On-going    Target Date  01/25/20      PT LONG TERM GOAL #5   Title  Patient will report an overall 50% improvement in neck and Lt shoulder pain with driving and other ADLS for improved quality of life    Time  8    Period  Weeks    Status  On-going    Target Date  01/25/20            Plan - 12/21/19 1120    Clinical Impression Statement  Pt arrived with questions regarding how to progress a home program that she can complete without increased symptoms. PT suggested that she try a circuit at home to include strength, flexibility and cardio. Pt agreed to try this and will monitor symptoms to determine length of time to do this each day.   Pt reports disrupted sleep and longer healing time needed after dry needling.  Pt requested to dry needle again with fewer areas covered. Pt with trigger point in Lt levator that causes scapular dyskinesis with retraction.  Exercises monitored by PT to help modify this movement without use of band to avoid this.  Pt demonstrated improved tissue mobility  after dry needling and soft tissue mobilization to the Lt levator, subscapularis and upper traps.  Pt with complex medical condition and will continue to benefit from skilled PT to address tissue mobility, postural strength and pain.    PT Frequency  2x / week    PT Duration  8 weeks    PT Treatment/Interventions  ADLs/Self Care Home Management;Cryotherapy;Electrical Stimulation;Moist  Heat;Traction;Ultrasound;Neuromuscular re-education;Therapeutic exercise;Therapeutic activities;Patient/family education;Manual techniques;Dry needling;Taping    PT Next Visit Plan  assess response to dry needling.  continue manual therapy and HEP advancement as able.    PT Home Exercise Plan  Access Code: [email or web address]    Consulted and Agree with Plan of Care  Patient       Patient will benefit from skilled therapeutic intervention in order to improve the following deficits and impairments:  Decreased range of motion, Increased fascial restricitons, Increased muscle spasms, Impaired UE functional use, Decreased activity tolerance, Pain, Decreased strength  Visit Diagnosis: Cervicalgia  Chronic left shoulder pain  Muscle weakness (generalized)  Stiffness of left shoulder, not elsewhere classified  Cramp and spasm     Problem List Patient Active Problem List   Diagnosis Date Noted  . Asthma 11/08/2019  . Wellness examination 11/08/2019  . Myasthenia gravis ([place]) 12/23/2017  . Hashimoto's disease 12/06/2016  . Vitamin D deficiency 05/31/2016  . Systemic lupus erythematosus ([place]) 10/26/2015  . Hypothyroidism 10/26/2015     Sigurd Sos, PT 12/21/19 11:24 AM  [facility] [address] [place], [place], [address] Phone: [phone]   Fax:  [phone]  Name: Kathryn Collier MRN: [id number] Date of Birth: May 05, 1971

## 2020-01-03 ENCOUNTER — Other Ambulatory Visit: Payer: Self-pay | Admitting: Family Medicine

## 2020-01-03 ENCOUNTER — Ambulatory Visit (INDEPENDENT_AMBULATORY_CARE_PROVIDER_SITE_OTHER): Payer: PRIVATE HEALTH INSURANCE | Admitting: Radiology

## 2020-01-03 ENCOUNTER — Other Ambulatory Visit: Payer: Self-pay

## 2020-01-03 DIAGNOSIS — E038 Other specified hypothyroidism: Secondary | ICD-10-CM

## 2020-01-03 DIAGNOSIS — E063 Autoimmune thyroiditis: Secondary | ICD-10-CM

## 2020-01-03 LAB — TSH: TSH: 1.65 mIU/L

## 2020-01-03 NOTE — Progress Notes (Signed)
Lab only for TSH

## 2020-01-04 ENCOUNTER — Ambulatory Visit: Payer: PRIVATE HEALTH INSURANCE

## 2020-01-04 ENCOUNTER — Telehealth: Payer: Self-pay | Admitting: Family Medicine

## 2020-01-04 NOTE — Telephone Encounter (Signed)
TSH looks good at 1.65.

## 2020-01-11 ENCOUNTER — Ambulatory Visit: Payer: 59 | Attending: Family Medicine

## 2020-01-11 ENCOUNTER — Other Ambulatory Visit: Payer: Self-pay

## 2020-01-11 DIAGNOSIS — M6281 Muscle weakness (generalized): Secondary | ICD-10-CM | POA: Insufficient documentation

## 2020-01-11 DIAGNOSIS — G8929 Other chronic pain: Secondary | ICD-10-CM | POA: Diagnosis present

## 2020-01-11 DIAGNOSIS — M25612 Stiffness of left shoulder, not elsewhere classified: Secondary | ICD-10-CM | POA: Insufficient documentation

## 2020-01-11 DIAGNOSIS — M25512 Pain in left shoulder: Secondary | ICD-10-CM | POA: Diagnosis present

## 2020-01-11 DIAGNOSIS — M542 Cervicalgia: Secondary | ICD-10-CM | POA: Diagnosis present

## 2020-01-11 DIAGNOSIS — R252 Cramp and spasm: Secondary | ICD-10-CM | POA: Insufficient documentation

## 2020-01-11 NOTE — Therapy (Signed)
[facility] [address], [place], [place], [address] Phone: [phone]   Fax:  [phone]  Physical Therapy Treatment  Patient Details  Name: Kathryn Collier MRN: [id number] Date of Birth: 14-Jul-1971 Referring Provider (PT): Dr. Junius Roads    Encounter Date: 01/11/2020  PT End of Session - 01/11/20 1118    Visit Number  6    Date for PT Re-Evaluation  01/25/20    Authorization Type  30 visit limit    Authorization Time Period  [employer]: 16 visits approved 1/26-3/23/21    Authorization - Visit Number  6    Authorization - Number of Visits  30    PT Start Time  8882    PT Stop Time  1110    PT Time Calculation (min)  47 min    Activity Tolerance  Patient tolerated treatment well    Behavior During Therapy  [facility] for tasks assessed/performed       Past Medical History:  Diagnosis Date  . Connective tissue disease ([place])   . 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 Hashimoto's disease   . Systemic lupus ([place])     History reviewed. No pertinent surgical history.  There were no vitals filed for this visit.  Subjective Assessment - 01/11/20 1025    Subjective  I took a couple of weeks off because it takes me a while to recover from dry needling.  The partner muscles were clicking and moving after dry needling.  My new goal is to be able to start playing tennis soon.    Patient Stated Goals  wish we could find out why structurely this is happening; pain relief    Currently in Pain?  Yes    Pain Score  1     Pain Location  Thoracic    Pain Orientation  Left    Pain Descriptors / Indicators  Aching    Pain Type  Chronic pain    Pain Onset  More than a month ago    Pain Frequency  Constant    Aggravating Factors   stress, sleep, cold    Pain Relieving Factors  heat, topical rub, CBD         [facility] PT Assessment - 01/11/20 0001      AROM   Left Shoulder Flexion  160 Degrees                    [facility] Adult PT Treatment/Exercise - 01/11/20 0001      Exercises   Exercises  Shoulder      Shoulder Exercises: Standing   Extension  Strengthening;Both;20 reps    Theraband Level (Shoulder Extension)  Level 1 (Yellow)    Extension Limitations  with band: fatigue after 8 reps    Row  Strengthening;Both;20 reps    Theraband Level (Shoulder Row)  Level 1 (Yellow)    Row Limitations  good technique    Other Standing Exercises  showed snow angels on the wall: focus on symmetry      Manual Therapy   Manual Therapy  Myofascial release;Soft tissue mobilization    Manual therapy comments  Lt rhomboids and subscapularis       Trigger Point Dry Needling - 01/11/20 0001    Consent Given?  Yes    Muscles Treated Upper Quadrant  Subscapularis;Rhomboids   LT only    Rhomboids Response  Twitch response elicited;Palpable increased muscle length    Subscapularis Response  Palpable increased muscle length;Twitch response elicited           PT Education - 01/11/20 1113    Education Details  how to work on symmetry with scapular movement, addition of rhomboid stretch-demo (no handout provided)    Person(s) Educated  Patient    Methods  Explanation;Demonstration    Comprehension  Verbalized understanding;Returned demonstration       PT Short Term Goals - 01/11/20 1032      PT SHORT TERM GOAL #1   Title  Pt will be independent with initial HEP    Status  Achieved      PT SHORT TERM GOAL #2   Title  Pt will have decrease in scapular pain while driving and during ADLS byt 17%      PT SHORT TERM GOAL #3   Title  Improved shoulder left shoulder flexion to 155 degrees and abduction to 165 degrees needed for reaching overhead    Status  Achieved        PT Long Term Goals - 12/14/19 1027      PT LONG TERM GOAL #1   Title  Pt will be independent with advanced HEP for shoulder and cervical stabilization and ROM maintenance    Time  8    Period  Weeks     Status  On-going    Target Date  01/25/20      PT LONG TERM GOAL #2   Title  Pt will increase AROM of neck to cervical flexion to 35 degrees, extension to 50 degrees, left sidebending to 35 degrees and left rotation to 50 degrees in order to drive with less discomfort    Time  8    Period  Weeks    Status  On-going    Target Date  01/25/20      PT LONG TERM GOAL #3   Title  Improve FOTO from35% to at least 29% for maximal functional mobility    Time  8    Period  Weeks    Status  On-going    Target Date  01/25/20      PT LONG TERM GOAL #4   Title  Left scapular and cervical strength grossly 4/5 to 4+/5 needed for lifting, pushing, pulling with greater ease    Time  8    Period  Weeks    Status  On-going    Target Date  01/25/20      PT LONG TERM GOAL #5   Title  Patient will report an overall 50% improvement in neck and Lt shoulder pain with driving and other ADLS for improved quality of life    Time  8    Period  Weeks    Status  On-going    Target Date  01/25/20            Plan - 01/11/20 1117    Clinical Impression Statement  Pt with lapse in treatment due to taking 2 weeks off.  Pt takes longer to recover from dry needling.  PT discussed exercises to address asymmetries in the Lt scapular region.  Pt is able to perform standing theraband exercises without substitution x 8 reps and pt will work to become consistent with these.  Pt with tension and trigger points in Lt subscapularis and rhomboids.  Limited needling performed per pt request and followed by soft tissue elongation.  Pt will return in 2 weeks for reassessment, update on HEP and dry needling as needed.  Comorbidities  lupus; myasthenia gravis; hypothyroidism; initial onset 30 years ago    PT Frequency  2x / week    PT Duration  8 weeks    PT Treatment/Interventions  ADLs/Self Care Home Management;Cryotherapy;Electrical Stimulation;Moist Heat;Traction;Ultrasound;Neuromuscular re-education;Therapeutic  exercise;Therapeutic activities;Patient/family education;Manual techniques;Dry needling;Taping    PT Next Visit Plan  assess response to dry needling.  continue manual therapy and HEP advancement as able.  Reassessement and request more visits for insurance    PT Home Exercise Plan  Access Code: [email or web address]    Recommended Other Services  recert is signed    Consulted and Agree with Plan of Care  Patient       Patient will benefit from skilled therapeutic intervention in order to improve the following deficits and impairments:  Decreased range of motion, Increased fascial restricitons, Increased muscle spasms, Impaired UE functional use, Decreased activity tolerance, Pain, Decreased strength  Visit Diagnosis: Chronic left shoulder pain  Cervicalgia  Muscle weakness (generalized)  Stiffness of left shoulder, not elsewhere classified  Cramp and spasm     Problem List Patient Active Problem List   Diagnosis Date Noted  . Asthma 11/08/2019  . Wellness examination 11/08/2019  . Myasthenia gravis ([place]) 12/23/2017  . Hashimoto's disease 12/06/2016  . Vitamin D deficiency 05/31/2016  . Systemic lupus erythematosus ([place]) 10/26/2015  . Hypothyroidism 10/26/2015    Sigurd Sos, PT 01/11/20 11:20 AM  [facility] [address] [place], [place], [address] Phone: [phone]   Fax:  [phone]  Name: Kathryn Collier MRN: [id number] Date of Birth: 1971-06-15

## 2020-01-24 ENCOUNTER — Other Ambulatory Visit: Payer: Self-pay

## 2020-01-24 ENCOUNTER — Ambulatory Visit: Payer: 59

## 2020-01-24 DIAGNOSIS — M25512 Pain in left shoulder: Secondary | ICD-10-CM | POA: Diagnosis not present

## 2020-01-24 DIAGNOSIS — G8929 Other chronic pain: Secondary | ICD-10-CM

## 2020-01-24 DIAGNOSIS — M6281 Muscle weakness (generalized): Secondary | ICD-10-CM

## 2020-01-24 DIAGNOSIS — M542 Cervicalgia: Secondary | ICD-10-CM

## 2020-01-24 DIAGNOSIS — M25612 Stiffness of left shoulder, not elsewhere classified: Secondary | ICD-10-CM

## 2020-01-24 DIAGNOSIS — R252 Cramp and spasm: Secondary | ICD-10-CM

## 2020-01-24 NOTE — Therapy (Addendum)
[facility] [address], [place], [place], [address] Phone: [phone]   Fax:  [phone]  Physical Therapy Treatment  Patient Details  Name: Kathryn Collier MRN: [id number] Date of Birth: January 01, 1971 Referring Provider (PT): Dr. Junius Roads    Encounter Date: 01/24/2020  PT End of Session - 01/24/20 1448    Visit Number  7    Date for PT Re-Evaluation  03/20/20    Authorization Time Period  [employer]: 16 visits approved 1/26-3/23/21 (requested more visits)    Authorization - Visit Number  7    Authorization - Number of Visits  30    PT Start Time  0867    PT Stop Time  1443    PT Time Calculation (min)  40 min    Activity Tolerance  Patient tolerated treatment well    Behavior During Therapy  [facility] for tasks assessed/performed       Past Medical History:  Diagnosis Date  . Connective tissue disease ([place])   . 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 Hashimoto's disease   . Systemic lupus ([place])     History reviewed. No pertinent surgical history.  There were no vitals filed for this visit.  Subjective Assessment - 01/24/20 1407    Subjective  I hit some tennis balls today.  I haven't been super consistent with my exercises.  I am getting clicking and popping under my shoulder blade on the Lt.    Patient Stated Goals  wish we could find out why structurely this is happening; pain relief    Currently in Pain?  No/denies         [facility] PT Assessment - 01/24/20 0001      Assessment   Medical Diagnosis  chronic left shoulder pain, Neck pain    Referring Provider (PT)  Dr. Junius Roads     Hand Dominance  Right      Prior Function   Level of Independence  Independent    Vocation  Unemployed    Vocation Requirements  previously a nanny     Leisure  church online; book clubs, used to do volunteer work       Observation/Other Assessments   Focus on Therapeutic Outcomes (FOTO)   32% limitation      AROM   Cervical Flexion  50    Cervical Extension  50    Cervical - Right Side Bend  40    Cervical - Left Side Bend  40    Cervical - Right Rotation  75    Cervical - Left Rotation  70      Strength   Left Shoulder Flexion  5/5    Left Shoulder ABduction  4+/5    Left Shoulder Internal Rotation  5/5    Left Shoulder External Rotation  4+/5                   [facility] Adult PT Treatment/Exercise - 01/24/20 0001      Manual Therapy   Manual Therapy  Myofascial release;Soft tissue mobilization    Manual therapy comments  Lt rhomboids and subscapularis       Trigger Point Dry Needling - 01/24/20 0001    Consent Given?  Yes    Muscles Treated Upper Quadrant  Subscapularis;Rhomboids   LT only    Rhomboids Response  Twitch response elicited;Palpable increased muscle length    Subscapularis Response  Palpable increased muscle length;Twitch response elicited  PT Education - 01/24/20 1446    Education Details  consistency with HEP, trigger point release with theracane or tennis balls    Person(s) Educated  Patient    Methods  Explanation    Comprehension  Verbalized understanding       PT Short Term Goals - 01/11/20 1032      PT SHORT TERM GOAL #1   Title  Pt will be independent with initial HEP    Status  Achieved      PT SHORT TERM GOAL #2   Title  Pt will have decrease in scapular pain while driving and during ADLS byt 28%      PT SHORT TERM GOAL #3   Title  Improved shoulder left shoulder flexion to 155 degrees and abduction to 165 degrees needed for reaching overhead    Status  Achieved        PT Long Term Goals - 01/24/20 1408      PT LONG TERM GOAL #1   Title  Pt will be independent with advanced HEP for shoulder and cervical stabilization and ROM maintenance    Time  8    Period  Weeks    Status  On-going    Target Date  03/20/20      PT LONG TERM GOAL #2   Title  Pt will increase AROM of neck to cervical flexion to 35 degrees,  extension to 50 degrees, left sidebending to 35 degrees and left rotation to 50 degrees in order to drive with less discomfort    Status  Achieved      PT LONG TERM GOAL #3   Title  Improve FOTO from35% to at least 29% for maximal functional mobility    Baseline  32% limitation    Time  8    Period  Weeks    Status  On-going      PT LONG TERM GOAL #4   Title  Left scapular and cervical strength grossly 4/5 to 4+/5 needed for lifting, pushing, pulling with greater ease    Status  Achieved      PT LONG TERM GOAL #5   Title  Patient will report an overall 50% improvement in neck and Lt shoulder pain with driving and other ADLS for improved quality of life    Baseline  25%    Time  8    Period  Weeks    Status  On-going            Plan - 01/24/20 1444    Clinical Impression Statement  Pt has been taking longer stretches between appointments due to longer recovery time needed after dry needling.  Pt has started to improve her activity levels and is playing tennis with her Lt UE.  Pt with improved Lt shoulder strength and improved cervical A/ROM overall.  Pt remains weak in her postural stabilizers and has not been consistent with HEP for strength.  Pt with trigger points and tension over Lt rhomboids and subscapularis that are improved overall.  Pt encouraged to use Theracane or tennis balls to work on trigger point release.  Pt will continue to benefit from dry needling and HEP advancement as needed every other week to every 3 weeks for up to 4 additional visits.    PT Frequency  Biweekly    PT Treatment/Interventions  ADLs/Self Care Home Management;Cryotherapy;Electrical Stimulation;Moist Heat;Traction;Ultrasound;Neuromuscular re-education;Therapeutic exercise;Therapeutic activities;Patient/family education;Manual techniques;Dry needling;Taping    PT Next Visit Plan  assess response to dry needling.  continue manual therapy and HEP advancement as able.  Reassessement and request more  visits for insurance    PT Home Exercise Plan  Access Code: [email or web address]    Recommended Other Services  recert signed 4/66/59    Consulted and Agree with Plan of Care  Patient       Patient will benefit from skilled therapeutic intervention in order to improve the following deficits and impairments:  Decreased range of motion, Increased fascial restricitons, Increased muscle spasms, Impaired UE functional use, Decreased activity tolerance, Pain, Decreased strength  Visit Diagnosis: Cervicalgia - Plan: PT plan of care cert/re-cert  Chronic left shoulder pain - Plan: PT plan of care cert/re-cert  Muscle weakness (generalized) - Plan: PT plan of care cert/re-cert  Stiffness of left shoulder, not elsewhere classified - Plan: PT plan of care cert/re-cert  Cramp and spasm - Plan: PT plan of care cert/re-cert     Problem List Patient Active Problem List   Diagnosis Date Noted  . Asthma 11/08/2019  . Wellness examination 11/08/2019  . Myasthenia gravis ([place]) 12/23/2017  . Hashimoto's disease 12/06/2016  . Vitamin D deficiency 05/31/2016  . Systemic lupus erythematosus ([place]) 10/26/2015  . Hypothyroidism 10/26/2015     Sigurd Sos, PT 01/24/20 2:52 PM PHYSICAL THERAPY DISCHARGE SUMMARY  Visits from Start of Care: 7  Current functional level related to goals / functional outcomes: Pt requested D/C from PT.  She has a well established HEP in place.  Pt required extra recovery time after dry needling and reported that she did better at another facility that used a laser after needling.     Remaining deficits: See above for current status.     Education / Equipment: HEP Plan: Patient agrees to discharge.  Patient goals were partially met. Patient is being discharged due to the patient's request.  ?????        Sigurd Sos, PT 03/21/20 7:22 AM  [facility] [address], [place], [place], [address] Phone:  [phone]   Fax:  [phone]  Name: Kathryn Collier MRN: [id number] Date of Birth: [birth year]

## 2020-02-14 ENCOUNTER — Encounter: Payer: Self-pay | Admitting: Family Medicine

## 2020-02-18 ENCOUNTER — Encounter: Payer: Self-pay | Admitting: Family Medicine

## 2020-02-18 ENCOUNTER — Ambulatory Visit (INDEPENDENT_AMBULATORY_CARE_PROVIDER_SITE_OTHER): Payer: 59 | Admitting: Family Medicine

## 2020-02-18 ENCOUNTER — Other Ambulatory Visit: Payer: Self-pay

## 2020-02-18 DIAGNOSIS — F419 Anxiety disorder, unspecified: Secondary | ICD-10-CM | POA: Diagnosis not present

## 2020-02-18 DIAGNOSIS — E063 Autoimmune thyroiditis: Secondary | ICD-10-CM

## 2020-02-18 DIAGNOSIS — E038 Other specified hypothyroidism: Secondary | ICD-10-CM | POA: Diagnosis not present

## 2020-02-18 MED ORDER — THYROID 30 MG PO TABS: 30.0000 mg | ORAL_TABLET | Freq: Two times a day (BID) | ORAL | 6 refills | Status: DC

## 2020-02-18 NOTE — Progress Notes (Signed)
Office Visit Note   Patient: Kathryn Collier           Date of Birth: [birth year]           MRN: [id number] Visit Date: 02/18/2020 Requested by: Karleen Hampshire., MD [place] [address],  [place] [address] PCP: Eunice Blase, MD  Subjective: Chief Complaint  Patient presents with  . medication check  . Anxiety    HPI: She is here for follow-up Hashimoto's thyroiditis.  On levothyroxine 112 mcg daily, she feels okay but she notices jitteriness occasionally and an anxious feeling when she should not feel anxious.  She thinks it may be a side effect of the medication.  She is having a slight autoimmune flare after getting the COVID-19 vaccines.  She is having occasional episodes of loose stools.                ROS: No fevers or chills.  All other systems were reviewed and are negative.  Objective: Vital Signs: There were no vitals taken for this visit.  Physical Exam:  General:  Alert and oriented, in no acute distress. Pulm:  Breathing unlabored. Psy:  Normal mood, congruent affect. Skin: No visible rash. Neck: No thyromegaly or nodules. CV: Regular rate and rhythm without murmurs, rubs, or gallops.  No peripheral edema.  2+ radial and posterior tibial pulses. Lungs: Clear to auscultation throughout with no wheezing or areas of consolidation. Extremities: 1+ upper and lower extremity DTRs.   Imaging: None today  Assessment & Plan: 1.  Hashimoto's thyroiditis -We elected to switch to Armour Thyroid taking either 30 mg twice daily or 60 mg once daily. -If she tolerates this, but is still having anxious feelings after a few weeks, she will then add [facility] GBX probiotics and L-Theanine. -Recheck thyroid labs in about 8 weeks.     Procedures: No procedures performed  No notes on file     PMFS History: Patient Active Problem List   Diagnosis Date Noted  . Asthma 11/08/2019  . Wellness examination 11/08/2019  . Anxiety disorder due to medical  condition 06/07/2019  . Herpes zoster without complication 37/62/8315  . Immunocompromised ([place]) 06/07/2019  . Recurrent major depressive disorder, in full remission ([place]) 06/07/2019  . Discharge from left nipple 04/01/2019  . Mastalgia 04/01/2019  . Jaw pain 08/19/2018  . Myasthenia gravis ([place]) 12/23/2017  . Rectal bleeding 12/23/2017  . Herpes simplex 06/17/2017  . Hashimoto's disease 12/06/2016  . Other acute recurrent sinusitis 12/06/2016  . Vitamin D deficiency 05/31/2016  . Systemic lupus erythematosus ([place]) 10/26/2015  . Hypothyroidism 10/26/2015  . Long-term use of hydroxychloroquine 10/26/2015  . Neck pain 10/26/2015  . Regular astigmatism of both eyes 10/26/2015  . Undifferentiated connective tissue disease ([place]) 12/18/2011   Past Medical History:  Diagnosis Date  . Connective tissue disease ([place])   . 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 Hashimoto's disease   . Systemic lupus (HCC)     Family History  Problem Relation Age of Onset  . Other Mother        brain tumor  . Cancer - Lung Father     History reviewed. No pertinent surgical history. Social History   Occupational History  . Occupation: n    Comment: nanny  Tobacco Use  . Smoking status: Never Smoker  . Smokeless tobacco: Never Used  Substance and Sexual Activity  . Alcohol use: Yes    Comment: 06/10/17 1-3 drinks/  2-4 nights weekly  . Drug use: No  . Sexual activity: Not on file

## 2020-02-28 ENCOUNTER — Ambulatory Visit: Payer: PRIVATE HEALTH INSURANCE

## 2020-03-07 ENCOUNTER — Encounter: Payer: Self-pay | Admitting: Family Medicine

## 2020-04-13 DIAGNOSIS — Z0289 Encounter for other administrative examinations: Secondary | ICD-10-CM

## 2020-04-14 ENCOUNTER — Ambulatory Visit: Payer: 59 | Admitting: Family Medicine

## 2020-04-14 ENCOUNTER — Ambulatory Visit: Payer: 59

## 2020-04-19 ENCOUNTER — Ambulatory Visit: Payer: 59 | Admitting: Family Medicine

## 2020-04-24 ENCOUNTER — Ambulatory Visit: Payer: 59 | Admitting: Family Medicine

## 2020-04-24 ENCOUNTER — Encounter: Payer: Self-pay | Admitting: Family Medicine

## 2020-04-24 ENCOUNTER — Other Ambulatory Visit: Payer: Self-pay

## 2020-04-24 VITALS — BP 115/78 | HR 100

## 2020-04-24 DIAGNOSIS — M542 Cervicalgia: Secondary | ICD-10-CM | POA: Diagnosis not present

## 2020-04-24 DIAGNOSIS — E063 Autoimmune thyroiditis: Secondary | ICD-10-CM | POA: Diagnosis not present

## 2020-04-24 DIAGNOSIS — E038 Other specified hypothyroidism: Secondary | ICD-10-CM | POA: Diagnosis not present

## 2020-04-24 DIAGNOSIS — F439 Reaction to severe stress, unspecified: Secondary | ICD-10-CM

## 2020-04-24 NOTE — Progress Notes (Signed)
Office Visit Note   Patient: Kathryn Collier           Date of Birth: Mar 05, 1971           MRN: [id number] Visit Date: 04/24/2020 Requested by: Karleen Hampshire., MD [place] [address],  [place] [address] PCP: Eunice Blase, MD  Subjective: Chief Complaint  Patient presents with  . Neck - Pain  . thyroid check/blood work    HPI: Kathryn Collier is here for monitoring of Hashimoto's thyroiditis.  Overall doing better on Armour Thyroid 60 mg daily.  Her anxiety symptoms are much more manageable on this.  From a stress/depression standpoint, unfortunately her 69 year old nephew was recently diagnosed with a brain tumor.  It may not be operable.  Kathryn Collier is helping her sister with his care.  It has been a significant stressor.  Kathryn Collier is taking bupropion still with good results.  And is also working with her counselor.  Kathryn Collier continues to have troubles with her neck and shoulders.  Kathryn Collier was getting some relief with dry needling, but Kathryn Collier does much better with a combination of dry needling and laser treatments.  Kathryn Collier plans to return to [facility] and pay out of network.  Kathryn Collier is also been having some head congestion that does not seem to be infectious.  We did not have enough time to fully go through this.  Kathryn Collier thinks crying is contributing to it.                ROS:   All other systems were reviewed and are negative.  Objective: Vital Signs: BP 115/78   Pulse 100   Physical Exam:  General:  Alert and oriented, in no acute distress. Pulm:  Breathing unlabored. Psy:  Normal mood, congruent affect.  Neck: Kathryn Collier has good range of motion.  Kathryn Collier has a tender trigger points in the upper cervical paraspinous muscles, the left rhomboid area and a little bit in the left trapezius belly.  Upper extremity strength is 5/5 throughout.   Imaging: No results found.  Assessment & Plan: 1.  Hashimoto's thyroiditis -We will check thyroid panel today.  We are not going to check antibodies because Kathryn Collier has not  implemented any dietary changes to affect this.  2.  Chronic myofascial neck pain with nonfocal neurologic exam -Continue with physical therapy.  If symptoms take a turn for the worse, we will proceed with x-rays and MRI scan followed by epidural injections if indicated.  Her symptoms are enough to warrant that right now.  3.  Depression with anxiety, doing well with bupropion.     Procedures: No procedures performed  No notes on file     PMFS History: Patient Active Problem List   Diagnosis Date Noted  . Asthma 11/08/2019  . Wellness examination 11/08/2019  . 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 Herpes zoster without complication 75/08/2584  . Immunocompromised ([place]) 06/07/2019  . Recurrent major depressive disorder, in full remission ([place]) 06/07/2019  . Discharge from left nipple 04/01/2019  . Mastalgia 04/01/2019  . Jaw pain 08/19/2018  . Myasthenia gravis ([place]) 12/23/2017  . Rectal bleeding 12/23/2017  . Herpes simplex 06/17/2017  . Hashimoto's disease 12/06/2016  . Other acute recurrent sinusitis 12/06/2016  . Vitamin D deficiency 05/31/2016  . Systemic lupus erythematosus ([place]) 10/26/2015  . Hypothyroidism 10/26/2015  . Long-term use of hydroxychloroquine 10/26/2015  . Neck pain 10/26/2015  . Regular astigmatism of both eyes 10/26/2015  . Undifferentiated connective  tissue disease ([place]) 12/18/2011   Past Medical History:  Diagnosis Date  . Connective tissue disease ([place])   . 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 Hashimoto's disease   . Systemic lupus (HCC)     Family History  Problem Relation Age of Onset  . Other Mother        brain tumor  . Cancer - Lung Father     History reviewed. No pertinent surgical history. Social History   Occupational History  . Occupation: n    Comment: nanny  Tobacco Use  . Smoking status: Never Smoker  . Smokeless tobacco: Never Used  Substance and Sexual Activity  . Alcohol use:  Yes    Comment: 06/10/17 1-3 drinks/ 2-4 nights weekly  . Drug use: No  . Sexual activity: Not on file

## 2020-04-25 ENCOUNTER — Telehealth: Payer: Self-pay | Admitting: Family Medicine

## 2020-04-25 LAB — THYROID PANEL WITH TSH
Free Thyroxine Index: 1.5 (ref 1.4–3.8)
T3 Uptake: 35 % (ref 22–35)
T4, Total: 4.2 ug/dL — ABNORMAL LOW (ref 5.1–11.9)
TSH: 9.92 mIU/L — ABNORMAL HIGH

## 2020-04-25 NOTE — Telephone Encounter (Signed)
TSH is high at 9.92.

## 2020-05-01 ENCOUNTER — Other Ambulatory Visit: Payer: Self-pay | Admitting: Family Medicine

## 2020-05-01 MED ORDER — THYROID 90 MG PO TABS
90.0000 mg | ORAL_TABLET | Freq: Every day | ORAL | 6 refills | Status: DC
Start: 2020-05-01 — End: 2020-11-08

## 2020-05-05 ENCOUNTER — Encounter: Payer: Self-pay | Admitting: Family Medicine

## 2020-05-05 MED ORDER — BUPROPION HCL ER (SR) 150 MG PO TB12: 150.0000 mg | ORAL_TABLET | Freq: Two times a day (BID) | ORAL | 6 refills | Status: DC

## 2020-05-25 ENCOUNTER — Encounter: Payer: Self-pay | Admitting: Family Medicine

## 2020-05-30 ENCOUNTER — Telehealth: Payer: Self-pay | Admitting: Family Medicine

## 2020-05-30 NOTE — Telephone Encounter (Signed)
Patient called needing to make an appointment to have labs done on June 29, 2020. Patient said she would like to be scheduled later in the day. The number to contact patient is [phone]

## 2020-05-31 NOTE — Telephone Encounter (Signed)
Left message to call back or send a message through MyChart: I am not here late on Thursdays, so I need to know if that day is the best day for her, or can she come on another day that week. If she must have Thursday, another assistant can draw her blood that day. Will await the patient's response.

## 2020-06-01 NOTE — Telephone Encounter (Signed)
I sent the patient a message through [facility] regarding this. Will await her response.

## 2020-06-27 ENCOUNTER — Encounter: Payer: Self-pay | Admitting: Family Medicine

## 2020-06-27 DIAGNOSIS — G8929 Other chronic pain: Secondary | ICD-10-CM

## 2020-06-30 ENCOUNTER — Ambulatory Visit: Payer: 59

## 2020-07-04 ENCOUNTER — Ambulatory Visit: Payer: 59 | Admitting: Physical Therapy

## 2020-07-06 ENCOUNTER — Encounter: Payer: Self-pay | Admitting: Family Medicine

## 2020-07-07 ENCOUNTER — Other Ambulatory Visit: Payer: Self-pay | Admitting: Family Medicine

## 2020-07-07 ENCOUNTER — Other Ambulatory Visit: Payer: Self-pay

## 2020-07-07 ENCOUNTER — Ambulatory Visit (INDEPENDENT_AMBULATORY_CARE_PROVIDER_SITE_OTHER): Payer: 59 | Admitting: Rehabilitative and Restorative Service Providers"

## 2020-07-07 ENCOUNTER — Encounter: Payer: Self-pay | Admitting: Rehabilitative and Restorative Service Providers"

## 2020-07-07 ENCOUNTER — Ambulatory Visit (INDEPENDENT_AMBULATORY_CARE_PROVIDER_SITE_OTHER): Payer: 59

## 2020-07-07 DIAGNOSIS — M6281 Muscle weakness (generalized): Secondary | ICD-10-CM

## 2020-07-07 DIAGNOSIS — M542 Cervicalgia: Secondary | ICD-10-CM | POA: Diagnosis not present

## 2020-07-07 DIAGNOSIS — G8929 Other chronic pain: Secondary | ICD-10-CM | POA: Diagnosis not present

## 2020-07-07 DIAGNOSIS — M25512 Pain in left shoulder: Secondary | ICD-10-CM

## 2020-07-07 DIAGNOSIS — E038 Other specified hypothyroidism: Secondary | ICD-10-CM

## 2020-07-07 DIAGNOSIS — E063 Autoimmune thyroiditis: Secondary | ICD-10-CM

## 2020-07-07 NOTE — Therapy (Signed)
[facility] [address] [place], [place], [address] Phone: [phone]   Fax:  [phone]  Physical Therapy Evaluation  Patient Details  Name: Kathryn Collier MRN: [id number] Date of Birth: 01-28-1971 Referring Provider (PT): Dr. Junius Roads   Encounter Date: 07/07/2020   PT End of Session - 07/07/20 0907    Visit Number 1    Number of Visits 12    Date for PT Re-Evaluation 09/01/20    Progress Note Due on Visit 10    PT Start Time 0915    PT Stop Time 1010    PT Time Calculation (min) 55 min    Activity Tolerance Patient tolerated treatment well    Behavior During Therapy [facility] for tasks assessed/performed           Past Medical History:  Diagnosis Date  . Connective tissue disease ([place])   . Connective tissue disease ([place])    undifferentiated  . Hashimoto's disease   . Systemic lupus ([place])     History reviewed. No pertinent surgical history.  There were no vitals filed for this visit.    Subjective Assessment - 07/07/20 0907    Subjective Pt. has complaints of chronic Lt shoulder/neck pain c history of physical therapy in past, most recently at non [facility] facility. Pt. indicated her Lt shoulder had just felt different than Rt shoulder.  Pt. indicated generally feeling extremely flexible.  Pt. stated feeling knotted complaints in Lt shoulder.  Pt. indicated her Lt shoulder seems to be worse at times c inflammation and also with stress at times.    Pertinent History Lupus    Diagnostic tests Previous Jaw xray    Patient Stated Goals Reduce pain    Currently in Pain? Yes    Pain Score 1    pain at worst 9/10   Pain Location Shoulder   cervical Lt   Pain Orientation Left   anterior Lt GH jt, posterior Lt shoulder, scapulothoracic pain   Pain Descriptors / Indicators Tightness;Aching;Sharp    Pain Type Chronic pain    Pain Onset More than a month ago    Pain Frequency Intermittent    Aggravating Factors  Various head turning, Lt shoulder  movement c exercise.    Pain Relieving Factors Sometimes stretching, massage at times, movement (walking)    Effect of Pain on Daily Activities Limiting exercise routine, pain at rest at times              [facility] PT Assessment - 07/07/20 0001      Assessment   Medical Diagnosis Chronic Lt shoulder/neck pain    Referring Provider (PT) Dr. Junius Roads    Onset Date/Surgical Date 05/04/20    Hand Dominance Right    Prior Therapy Previous treatment in past year, most recently c different clinic      Precautions   Precautions None      Restrictions   Weight Bearing Restrictions No      Balance Screen   Has the patient fallen in the past 6 months No    Is the patient reluctant to leave their home because of a fear of falling?  No      Prior Function   Level of Independence Independent    Leisure exercise class, swimming      Cognition   Overall Cognitive Status Within Functional Limits for tasks assessed      Posture/Postural Control   Posture/Postural Control Postural limitations    Postural Limitations Rounded Shoulders;Forward head  anterior scapular tilt     ROM / Strength   AROM / PROM / Strength Strength;PROM;AROM      AROM   Overall AROM Comments Excessive upward rotation, protraction noted in Lt scapular movement c elevation attempts   Gaastra jt L WFL   AROM Assessment Site Shoulder;Cervical    Right/Left Shoulder Left;Right    Cervical Flexion 45    Cervical Extension 54   Lt cervical pulling   Cervical - Right Rotation 60   Lt cervical pain   Cervical - Left Rotation 65      Strength   Strength Assessment Site Shoulder;Elbow;Forearm    Right/Left Shoulder Left;Right    Right Shoulder Flexion 5/5    Right Shoulder ABduction 5/5    Right Shoulder Internal Rotation 5/5    Right Shoulder External Rotation 5/5    Left Shoulder Flexion 4+/5    Left Shoulder ABduction 4/5    Left Shoulder Internal Rotation 5/5    Left Shoulder External Rotation 4+/5    Right/Left  Elbow Left;Right    Right Elbow Flexion 5/5    Right Elbow Extension 5/5    Left Elbow Flexion 5/5    Left Elbow Extension 5/5    Right/Left Forearm Left;Right      Palpation   Palpation comment TrP noted in Lt infraspinatus, levator scap, L pec major/minor                      Objective measurements completed on examination: See above findings.       [facility] Treatment/Exercise - 07/07/20 0001      Self-Care   Self-Care Other Self-Care Comments    Other Self-Care Comments  Self care time included education on heat use for post manual intervention soreness reduction, as well as importance of HEP use to manage soreness complaints in musculature.  Education given on mechanisms of trigger point release techniques      Exercises   Exercises Shoulder;Other Exercises    Other Exercises  HEP instruction/performance c cues and handout.  HEP consisted of levator scap stretch 15 sec x 5, prone scapular retraction 5 sec x 10, SA press on elbows 5 sec x 10, tband green er, ir 3 x 10 each      Manual Therapy   Manual therapy comments compression to Lt upper trap, Lt infraspinatus            Trigger Point Dry Needling - 07/07/20 0001    Consent Given? Yes    Education Handout Provided No    Muscles Treated Upper Quadrant Infraspinatus   Lt   Infraspinatus Response Twitch response elicited                PT Education - 07/07/20 0907    Education Details HEP, POC, DN    Person(s) Educated Patient    Methods Explanation;Demonstration;Verbal cues;Handout    Comprehension Returned demonstration;Verbalized understanding            PT Short Term Goals - 07/07/20 0908      PT SHORT TERM GOAL #1   Title Patient will demonstrate independent use of home exercise program to maintain progress from in clinic treatments.    Time 2    Period Weeks    Status New    Target Date 07/21/20             PT Long Term Goals - 07/07/20 0908      PT LONG TERM GOAL  #  1   Title Patient will demonstrate/report pain at worst less than or equal to 2/10 to facilitate minimal limitation in daily activity secondary to pain symptoms.    Time 8    Period Weeks    Status New    Target Date 09/01/20      PT LONG TERM GOAL #2   Title Patient will demonstrate independent use of home exercise program to facilitate ability to maintain/progress functional gains from skilled physical therapy services.    Time 8    Period Weeks    Status New    Target Date 09/01/20      PT LONG TERM GOAL #3   Title Pt. will demonstrate Lt UE MMT 5/5 throughout to facilitate usual workout and exercise at PLOF.    Time 8    Period Weeks    Status New    Target Date 09/01/20      PT LONG TERM GOAL #4   Title Pt. will demonstrate cervical AROM WFL s symptoms to facilitate usual daily movement and driving at PLOF.    Time 8    Period Weeks    Status New    Target Date 09/01/20                  Plan - 07/07/20 0909    Clinical Impression Statement Patient is a 49 y.o. female who comes to clinic with complaints of Lt shoulder, neck pain with mobility, strength  deficits that impair their ability to perform usual daily and recreational functional activities without increase difficulty/symptoms at this time.  Patient to benefit from skilled PT services to address impairments and limitations to improve to previous level of function without restriction secondary to condition.    Personal Factors and Comorbidities Other;Time since onset of injury/illness/exacerbation   lupus   Examination-Activity Limitations Reach Overhead;Lift;Carry    Examination-Participation Restrictions Community Activity;Other;Driving   exercise routine   Stability/Clinical Decision Making Stable/Uncomplicated    Clinical Decision Making Low    Rehab Potential Good    PT Frequency --   1-2x/week   PT Duration 8 weeks    PT Treatment/Interventions ADLs/Self Care Home Management;Electrical  Stimulation;Cryotherapy;Iontophoresis 42m/ml Dexamethasone;Moist Heat;Traction;Balance training;Therapeutic exercise;Therapeutic activities;Functional mobility training;Stair training;Gait training;Ultrasound;Neuromuscular re-education;Patient/family education;Manual techniques;Vasopneumatic Device;Taping;Dry needling;Passive range of motion;Spinal Manipulations;Joint Manipulations    PT Next Visit Plan DN prn, improved scapulothoracic control, Lt UE strength/control    PT Home Exercise Plan [email or web address]    Consulted and Agree with Plan of Care Patient           Patient will benefit from skilled therapeutic intervention in order to improve the following deficits and impairments:  Decreased endurance, Hypomobility, Pain, Impaired UE functional use, Decreased strength, Decreased activity tolerance, Decreased mobility, Increased muscle spasms, Impaired perceived functional ability, Decreased range of motion, Decreased coordination, Impaired flexibility, Postural dysfunction  Visit Diagnosis: Chronic left shoulder pain  Cervicalgia  Muscle weakness (generalized)     Problem List Patient Active Problem List   Diagnosis Date Noted  . Asthma 11/08/2019  . Wellness examination 11/08/2019  . 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 Herpes zoster without complication 081/82/9937 . Immunocompromised ([place] 06/07/2019  . Recurrent major depressive disorder, in full remission ([place] 06/07/2019  . Discharge from left nipple 04/01/2019  . Mastalgia 04/01/2019  . Jaw pain 08/19/2018  . Myasthenia gravis ([place] 12/23/2017  . Rectal bleeding 12/23/2017  . Herpes simplex 06/17/2017  . Hashimoto's disease 12/06/2016  . Other acute recurrent sinusitis 12/06/2016  .  Vitamin D deficiency 05/31/2016  . Systemic lupus erythematosus ([place]) 10/26/2015  . Hypothyroidism 10/26/2015  . Long-term use of hydroxychloroquine 10/26/2015  . Neck pain 10/26/2015  . Regular astigmatism of both eyes  10/26/2015  . Undifferentiated connective tissue disease ([place]) 12/18/2011   Scot Jun, PT, DPT, OCS, ATC 07/07/20  10:53 AM    [facility] [address] [place], [place], [address] Phone: [phone]   Fax:  [phone]  Name: Kathryn Collier MRN: [id number] Date of Birth: 12/13/1970

## 2020-07-07 NOTE — Patient Instructions (Signed)
Access Code: [email or web address] URL: [email or web address] Date: 07/07/2020 Prepared by: Scot Jun  Exercises Prone Scapular Retraction - 2 x daily - 7 x weekly - 1 sets - 10 reps - 5 hold Shoulder External Rotation with Anchored Resistance - 2 x daily - 7 x weekly - 10 reps - 3 sets Shoulder Internal Rotation with Resistance - 2 x daily - 7 x weekly - 3 sets - 10 reps Gentle Levator Scapulae Stretch - 2 x daily - 7 x weekly - 1 sets - 5 reps - 30 hold Prone Scapular Protraction Retraction AROM on Forearms - 2 x daily - 7 x weekly - 1 sets - 10 reps - 5 hold

## 2020-07-07 NOTE — Progress Notes (Signed)
Lab visit to recheck thyroid: TSH, T4 & T3,free

## 2020-07-08 ENCOUNTER — Encounter: Payer: Self-pay | Admitting: Family Medicine

## 2020-07-08 LAB — TSH: TSH: 1.99 mIU/L

## 2020-07-08 LAB — T4, FREE: Free T4: 1.1 ng/dL (ref 0.8–1.8)

## 2020-07-08 LAB — T3, FREE: T3, Free: 3.3 pg/mL (ref 2.3–4.2)

## 2020-07-08 NOTE — Progress Notes (Signed)
TSH looks good at 1.99.

## 2020-10-03 ENCOUNTER — Encounter: Payer: Self-pay | Admitting: Family Medicine

## 2020-10-03 ENCOUNTER — Other Ambulatory Visit: Payer: Self-pay

## 2020-10-03 ENCOUNTER — Ambulatory Visit: Payer: 59 | Admitting: Family Medicine

## 2020-10-03 VITALS — BP 88/54 | HR 80 | Ht 66.0 in | Wt 144.8 lb

## 2020-10-03 DIAGNOSIS — G7000 Myasthenia gravis without (acute) exacerbation: Secondary | ICD-10-CM | POA: Diagnosis not present

## 2020-10-03 DIAGNOSIS — E063 Autoimmune thyroiditis: Secondary | ICD-10-CM | POA: Diagnosis not present

## 2020-10-03 DIAGNOSIS — M25512 Pain in left shoulder: Secondary | ICD-10-CM

## 2020-10-03 DIAGNOSIS — F3342 Major depressive disorder, recurrent, in full remission: Secondary | ICD-10-CM

## 2020-10-03 DIAGNOSIS — G8929 Other chronic pain: Secondary | ICD-10-CM

## 2020-10-03 NOTE — Progress Notes (Signed)
Office Visit Note   Patient: Kathryn Collier           Date of Birth: 1971-05-31           MRN: [id number] Visit Date: 10/03/2020 Requested by: Eunice Blase, MD [place],  [place] [address] PCP: Eunice Blase, MD  Subjective: Chief Complaint  Patient presents with   Left Shoulder - Pain    Feels like something "rubs" with certain arm movements. Has been an intermittent problem for years. Worse in the colder weather.    ptosis left eye, myasthenia gravis is "acting up again"    HPI: She is here with a couple concerns.  Her chronic left shoulder pain has taken a turn for the worse.  She is having a lot of crunching and grinding sensations when she moves her arm.  She is not taking anything on a regular basis for her pain.  She is not currently doing physical therapy.  Her myasthenia gravis is a little bit worse as well, she is having ptosis of the left eye.  She is scheduled to see ophthalmology in February.  She will be seeing neurology in January.  She has been taking pyridostigmine in the last couple days with some improvement.  She has been having difficulty focusing, occasionally double vision.  She is still under a lot of stress with her nephew going through brain cancer treatments.  Overall she is handling it pretty well.                ROS:   All other systems were reviewed and are negative.  Objective: Vital Signs: BP (!) 88/54    Pulse 80    Ht 5' 6"  (1.676 m)    Wt 144 lb 12.8 oz (65.7 kg)    BMI 23.37 kg/m   Physical Exam:  General:  Alert and oriented, in no acute distress. Pulm:  Breathing unlabored. Psy:  Normal mood, congruent affect  HEENT: She does have ptosis of the left upper eyelid.  A photo was taken for her chart. Left shoulder: Full active range of motion.  There is significant crepitus with active motion, seems to be localizing to the scapular region.  With passive glenohumeral motion, I am not able to reproduce the crepitus.  Rotator  cuff strength is 5/5, O'Brien's test is equivocal.   Imaging: No results found.  Assessment & Plan: 1.  Chronic left shoulder pain now with crepitus, question glenoid labrum tear versus scapulothoracic bursitis -MRI left shoulder to further evaluate. -Could contemplate scapulothoracic bursa injection if no labrum tear seen.  2.  Myasthenia gravis with left eye ptosis -Currently on medication for this, scheduled to meet with neurology and ophthalmology.     Procedures: No procedures performed  No notes on file     PMFS History: Patient Active Problem List   Diagnosis Date Noted   Asthma 11/08/2019   Wellness examination 11/08/2019   Anxiety disorder due to medical condition 06/07/2019   Herpes zoster without complication 73/41/9379   Immunocompromised ([place]) 06/07/2019   Recurrent major depressive disorder, in full remission ([place]) 06/07/2019   Discharge from left nipple 04/01/2019   Mastalgia 04/01/2019   Jaw pain 08/19/2018   Myasthenia gravis ([place]) 12/23/2017   Rectal bleeding 12/23/2017   Herpes simplex 06/17/2017   Hashimoto's disease 12/06/2016   Other acute recurrent sinusitis 12/06/2016   Vitamin D deficiency 05/31/2016   Systemic lupus erythematosus ([place]) 10/26/2015   Hypothyroidism 10/26/2015   Long-term use of  hydroxychloroquine 10/26/2015   Neck pain 10/26/2015   Regular astigmatism of both eyes 10/26/2015   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 No pertinent surgical history. Social History   Occupational History   Occupation: n    Comment: nanny  Tobacco Use   Smoking status: Never Smoker    Smokeless tobacco: Never Used  Substance and Sexual Activity   Alcohol use: Yes    Comment: 06/10/17 1-3 drinks/ 2-4 nights weekly   Drug use: No   Sexual activity: Not on file

## 2020-10-12 ENCOUNTER — Encounter: Payer: Self-pay | Admitting: Family Medicine

## 2020-10-12 DIAGNOSIS — E063 Autoimmune thyroiditis: Secondary | ICD-10-CM

## 2020-10-23 ENCOUNTER — Ambulatory Visit
Admission: RE | Admit: 2020-10-23 | Discharge: 2020-10-23 | Disposition: A | Discharge status: Home / Self Care | Payer: 59 | Source: Ambulatory Visit | Attending: Family Medicine | Admitting: Family Medicine

## 2020-10-23 DIAGNOSIS — G8929 Other chronic pain: Secondary | ICD-10-CM

## 2020-10-24 ENCOUNTER — Telehealth: Payer: Self-pay | Admitting: Family Medicine

## 2020-10-24 NOTE — Telephone Encounter (Signed)
MRI shows some chronic irritation of the rotator cuff, but no tears.  No clear-cut indication for surgery.  Unless it has improved since we last spoke, could contemplate doing a cortisone injection for relief.

## 2020-10-25 ENCOUNTER — Ambulatory Visit (INDEPENDENT_AMBULATORY_CARE_PROVIDER_SITE_OTHER): Payer: 59

## 2020-10-25 ENCOUNTER — Other Ambulatory Visit: Payer: Self-pay | Admitting: Family Medicine

## 2020-10-25 ENCOUNTER — Other Ambulatory Visit: Payer: Self-pay

## 2020-10-25 DIAGNOSIS — E063 Autoimmune thyroiditis: Secondary | ICD-10-CM

## 2020-10-25 NOTE — Progress Notes (Signed)
Lab only visit: TPO antibody and thyroid panel + TSH drawn.

## 2020-10-26 ENCOUNTER — Telehealth: Payer: Self-pay | Admitting: Family Medicine

## 2020-10-26 LAB — THYROID PANEL WITH TSH
Free Thyroxine Index: 2.1 (ref 1.4–3.8)
T3 Uptake: 36 % — ABNORMAL HIGH (ref 22–35)
T4, Total: 5.7 ug/dL (ref 5.1–11.9)
TSH: 0.54 mIU/L

## 2020-10-26 LAB — THYROID PEROXIDASE ANTIBODY: Thyroperoxidase Ab SerPl-aCnc: 192 IU/mL — ABNORMAL HIGH (ref <9)

## 2020-10-26 NOTE — Telephone Encounter (Signed)
Thyroid looks great with TSH of 0.54 and improved antibodies of 192.

## 2020-10-28 ENCOUNTER — Other Ambulatory Visit: Payer: Self-pay | Admitting: Family Medicine

## 2020-10-28 ENCOUNTER — Encounter: Payer: Self-pay | Admitting: Family Medicine

## 2020-11-01 ENCOUNTER — Other Ambulatory Visit: Payer: 59

## 2020-11-06 ENCOUNTER — Ambulatory Visit: Payer: BLUE CROSS/BLUE SHIELD | Admitting: Diagnostic Neuroimaging

## 2020-11-08 ENCOUNTER — Other Ambulatory Visit: Payer: Self-pay | Admitting: Family Medicine

## 2020-11-18 ENCOUNTER — Other Ambulatory Visit: Payer: Self-pay | Admitting: Diagnostic Neuroimaging

## 2020-11-19 ENCOUNTER — Encounter: Payer: Self-pay | Admitting: Family Medicine

## 2020-11-20 MED ORDER — PYRIDOSTIGMINE BROMIDE 60 MG PO TABS: 60.0000 mg | ORAL_TABLET | Freq: Three times a day (TID) | ORAL | 3 refills | Status: DC

## 2020-11-27 ENCOUNTER — Ambulatory Visit: Payer: 59 | Admitting: Diagnostic Neuroimaging

## 2020-11-27 ENCOUNTER — Encounter: Payer: Self-pay | Admitting: Diagnostic Neuroimaging

## 2020-11-27 VITALS — BP 100/66 | HR 77 | Ht 65.75 in | Wt 143.2 lb

## 2020-11-27 DIAGNOSIS — G7000 Myasthenia gravis without (acute) exacerbation: Secondary | ICD-10-CM

## 2020-11-27 MED ORDER — PYRIDOSTIGMINE BROMIDE 60 MG PO TABS: 30.0000 mg | ORAL_TABLET | Freq: Three times a day (TID) | ORAL | 12 refills | Status: DC

## 2020-11-27 NOTE — Progress Notes (Signed)
[facility]  PATIENT: Kathryn Collier DOB: 1971/10/03  REFERRING CLINICIAN: S Furr HISTORY FROM: patient REASON FOR VISIT: follow up    HISTORICAL  CHIEF COMPLAINT:  Chief Complaint  Patient presents with  . Ocular myasthenia    Rm 7, last seen 02/2018, friend- Mikki Santee "started in Nov with ptosis, I went 1 1/2 years without it but Mestinon doesn't seem to be working as well"    HISTORY OF PRESENT ILLNESS:   UPDATE (11/27/20, VRP): Since last visit, doing well until November 2021 when she had lupus flareup with achiness, fatigue, joint pain.  Around this time she also noticed blurred vision, tension over her forehead, left ptosis, and felt that myasthenia was also flaring up.  Patient had been off of Mestinon for a while but restarted in November 2021 due to the symptoms.  She is taken 60 mg twice a day with mild benefit. No other alleviating or aggravating factors.    UPDATE (02/04/18, VRP): Since last visit, doing well. Tolerating meds. No alleviating or aggravating factors. Left ptosis is intermittent. Taking pyridostigmine (6am 5m, 11am 676m 4pm 3079m9pm 41m16mSome anxiety related to long term symptoms and disease prognosis.   UPDATE (09/30/17, VRP): Since last visit, doing about the same. Tolerating pyridostigmine 60mg31mee times a day. No alleviating or aggravating factors.   PRIOR HPI (06/10/17): 45 ye42 old female here for evaluation of myasthenia gravis. Patient has history of SLE, undifferentiated connective tissue disease, Hashimoto's hypothyroidism, currently on hydroxychloroquine, diclofenac, levothyroxine, developed onset of left eye drooping eyelid on 05/03/2017. No double vision. Patient has had intermittent slurred speech and swallow difficulty for past 15 years. Patient went to eye doctor who checked myasthenia gravis antibodies, which returned positive. Patient referred to me for further evaluation. Of note patient has been involved in phase II clinical  research study with medication "evobrutinib" for lupus, from February 25, 2017 - May 20, 2017. Patient also was off of medication from July 9 until July or 13, 2018. Patient denies any generalized muscle weakness problems, major swallowing or speech difficulties, double vision. Patient continues to have fatigable weakness in left eyelid.   REVIEW OF SYSTEMS: Full 14 system review of systems performed and negative with exception of: as per HPI.     ALLERGIES: Allergies  Allergen Reactions  . Corticosteroids Other (See Comments)    Rage, irritability, dperession  . Other     Mold, dust mites, pollens of trees/grass  . Prednisone Other (See Comments)    "depression, rage, irritability"  . Sertraline Other (See Comments)    No SSRIs  . Sulfa Antibiotics     Not supposed to take d/t lupus    HOME MEDICATIONS: Outpatient Medications Prior to Visit  Medication Sig Dispense Refill  . ARMOUR THYROID 90 MG tablet TAKE 1 TABLET BY MOUTH EVERY DAY BEFORE BREAKFAST 30 tablet 6  . b complex vitamins capsule Take by mouth.    . Bromelains 500 MG TABS Take by mouth 3 (three) times daily.    . buPMarland KitchenOPion (WELLBUTRIN SR) 150 MG 12 hr tablet TAKE 1 TABLET(150 MG) BY MOUTH TWICE DAILY 60 tablet 6  . Cholecalciferol 25 MCG (1000 UT) tablet Take by mouth.    . cyclobenzaprine (FLEXERIL) 10 MG tablet Take 10 mg by mouth 3 (three) times daily as needed for muscle spasms.    . diclofenac (VOLTAREN) 75 MG EC tablet Take 75 mg by mouth 2 (two) times daily.    . hydroxychloroquine (PLAQUENIL) 200 MG tablet  Take by mouth 2 (two) times daily.    . montelukast (SINGULAIR) 10 MG tablet TK 1 T PO QD    . NON FORMULARY     . NON FORMULARY     . NON FORMULARY 500 mg in the morning and at bedtime.    . NON FORMULARY daily.    . NORLYDA 0.35 MG tablet TAKE 1 TABLET(0.35 MG) BY MOUTH DAILY 28 tablet 11  . Omega-3 1000 MG CAPS Take by mouth.    Marland Kitchen OVER THE COUNTER MEDICATION     . pyridostigmine (MESTINON) 60 MG  tablet Take 1 tablet (60 mg total) by mouth 3 (three) times daily. 90 tablet 3  . Probiotic Product (PROBIOTIC-10 PO) Take by mouth. (Patient not taking: Reported on 11/27/2020)    . pyridostigmine (MESTINON) 60 MG tablet Take by mouth.     No facility-administered medications prior to visit.    PAST MEDICAL HISTORY: Past Medical History:  Diagnosis Date  . Connective tissue disease ([place])   . Connective tissue disease ([place])    undifferentiated  . Hashimoto's disease   . Ocular myasthenia ([place])   . Systemic lupus ([place])     PAST SURGICAL HISTORY: Past Surgical History:  Procedure Laterality Date  . WISDOM TOOTH EXTRACTION      FAMILY HISTORY: Family History  Problem Relation Age of Onset  . Other Mother        brain tumor  . Cancer - Lung Father     SOCIAL HISTORY:  Social History   Socioeconomic History  . Marital status: Single    Spouse name: Not on file  . Number of children: 0  . Years of education: 53  . Highest education level: Not on file  Occupational History  . Occupation: n    Comment: nanny  Tobacco Use  . Smoking status: Never Smoker  . Smokeless tobacco: Never Used  Substance and Sexual Activity  . Alcohol use: Yes    Comment: 11/27/20 1-3 drinks/ 2-4 nights weekly  . Drug use: No  . Sexual activity: Not on file  Other Topics Concern  . Not on file  Social History Narrative   Lives with boyfriend   Caffeine- coffee, 1 cup daily   Social Determinants of Health   Financial Resource Strain: Not on file  Food Insecurity: Not on file  Transportation Needs: Not on file  Physical Activity: Not on file  Stress: Not on file  Social Connections: Not on file  Intimate Partner Violence: Not on file     PHYSICAL EXAM  GENERAL EXAM/CONSTITUTIONAL: Vitals:  Vitals:   11/27/20 1546  BP: 100/66  Pulse: 77  Weight: 143 lb 3.2 oz (65 kg)  Height: 5' 5.75" (1.67 m)   Body mass index is 23.29 kg/m. No exam data present  Patient is in no  distress; well developed, nourished and groomed; neck is supple  CARDIOVASCULAR:  Examination of carotid arteries is normal; no carotid bruits  Regular rate and rhythm, no murmurs  Examination of peripheral vascular system by observation and palpation is normal  EYES:  Ophthalmoscopic exam of optic discs and posterior segments is normal; no papilledema or hemorrhages  MUSCULOSKELETAL:  Gait, strength, tone, movements noted in Neurologic exam below  NEUROLOGIC: MENTAL STATUS:  No flowsheet data found.  awake, alert, oriented to person, place and time  recent and remote memory intact  normal attention and concentration  language fluent, comprehension intact, naming intact,   fund of knowledge appropriate  CRANIAL NERVE:  2nd - no papilledema on fundoscopic exam  2nd, 3rd, 4th, 6th - pupils equal and reactive to light, visual fields full to confrontation, extraocular muscles intact, no nystagmus  5th - facial sensation symmetric  7th - facial strength symmetric  8th - hearing intact  9th - palate elevates symmetrically, uvula midline  11th - shoulder shrug symmetric  12th - tongue protrusion midline  MOTOR:   normal bulk and tone, full strength in the BUE, BLE  SENSORY:   normal and symmetric to light touch, temperature, vibration  COORDINATION:   finger-nose-finger, fine finger movements normal  REFLEXES:   deep tendon reflexes present and symmetric  GAIT/STATION:   narrow based gait; romberg is negative    DIAGNOSTIC DATA (LABS, IMAGING, TESTING) - I reviewed patient records, labs, notes, testing and imaging myself where available.  No results found for: WBC, HGB, HCT, MCV, PLT No results found for: NA, K, CL, CO2, GLUCOSE, BUN, CREATININE, CALCIUM, PROT, ALBUMIN, AST, ALT, ALKPHOS, BILITOT, GFRNONAA, GFRAA No results found for: CHOL, HDL, LDLCALC, LDLDIRECT, TRIG, CHOLHDL No results found for: HGBA1C No results found for: VITAMINB12 Lab  Results  Component Value Date   TSH 0.54 10/25/2020     05/15/17 AchR binding ab - 2.69 (H)  01/11/19 AchR - 1.61   08/02/19 AchR - 1.56  06/24/17 CT chest - No evidence of thymoma. - Scattered lymph nodes in the axilla and subpectoral regions bilaterally. These are likely reactive in nature and may be related to the patient's underlying history of lupus. Short-term follow-up in 6 months is recommended to assess for stability/resolution. - Nonobstructing left renal stone.     ASSESSMENT AND PLAN  50 y.o. year old female here with SLE, undifferentiated connective tissue disease, Hashimoto's hypothyroidism, currently on hydroxychloroquine, diclofenac, levothyroxine, developed onset of left eye drooping eyelid on 05/03/2017. AchR ab positive.    Dx: ocular myasthenia  1. Ocular myasthenia gravis ([place])     PLAN:  - continue pyridostigmine 30-60m three times a day - in future, could consider imuran or cellcept for myasthenia gravis  Meds ordered this encounter  Medications  . pyridostigmine (MESTINON) 60 MG tablet    Sig: Take 0.5-1 tablets (30-60 mg total) by mouth 3 (three) times daily.    Dispense:  90 tablet    Refill:  12   Return for pending if symptoms worsen or fail to improve.    VPenni Bombard MD 16/16/8372 39:02PM Certified in Neurology, Neurophysiology and Neuroimaging  [facility] [address] SLake HeritageGSan Sebastian [place] [phone]

## 2020-11-27 NOTE — Patient Instructions (Signed)
-   continue pyridostigmine 30-64m three times a day

## 2020-12-29 ENCOUNTER — Encounter: Payer: Self-pay | Admitting: Family Medicine

## 2020-12-29 ENCOUNTER — Other Ambulatory Visit: Payer: Self-pay

## 2020-12-29 ENCOUNTER — Ambulatory Visit (INDEPENDENT_AMBULATORY_CARE_PROVIDER_SITE_OTHER): Payer: 59 | Admitting: Family Medicine

## 2020-12-29 VITALS — BP 129/77 | HR 90 | Ht 65.75 in | Wt 138.0 lb

## 2020-12-29 DIAGNOSIS — E559 Vitamin D deficiency, unspecified: Secondary | ICD-10-CM | POA: Diagnosis not present

## 2020-12-29 DIAGNOSIS — M329 Systemic lupus erythematosus, unspecified: Secondary | ICD-10-CM | POA: Diagnosis not present

## 2020-12-29 DIAGNOSIS — E063 Autoimmune thyroiditis: Secondary | ICD-10-CM

## 2020-12-29 DIAGNOSIS — G7000 Myasthenia gravis without (acute) exacerbation: Secondary | ICD-10-CM | POA: Diagnosis not present

## 2020-12-29 MED ORDER — NORETHINDRONE 0.35 MG PO TABS
ORAL_TABLET | ORAL | 3 refills | Status: DC
Start: 2020-12-29 — End: 2021-05-04

## 2020-12-29 MED ORDER — THYROID 90 MG PO TABS: ORAL_TABLET | ORAL | 6 refills | Status: DC

## 2020-12-29 MED ORDER — MONTELUKAST SODIUM 10 MG PO TABS: 10.0000 mg | ORAL_TABLET | Freq: Every day | ORAL | 3 refills | Status: DC

## 2020-12-29 MED ORDER — BUPROPION HCL ER (SR) 150 MG PO TB12: ORAL_TABLET | ORAL | 3 refills | Status: DC

## 2020-12-29 NOTE — Progress Notes (Signed)
Office Visit Note   Patient: Kathryn Collier           Date of Birth: [birth year]           MRN: [id number] Visit Date: 12/29/2020 Requested by: Kathryn Blase, MD [place],  [place] [address] PCP: Kathryn Blase, MD  Subjective: Chief Complaint  Patient presents with  . Other    Flare up of lupus and myasthenia gravis Medication questions Vitamin D check    HPI: She is here for follow-up.  Her friend Kathryn Collier is with her today.    She has been struggling with her lupus and myasthenia gravis, having increased pain lately and visual troubles.  She is on pyridostigmine twice daily, but plans to change the dosing regimen to take her first dose midday instead of early in the morning.  She will let me know how this works for her.  She has been under extra stress related to her nephew whose condition has taken a turn for the worse.  From a thyroid standpoint her TSH was excellent 2 months ago on Armour Thyroid 90 mg daily.  Recently she has been having increased congestion.  She was off Singulair due to insurance issues, but is now on it again and it seems to be helping.  She has a history of vitamin D deficiency and is due to have her levels checked.                ROS:   All other systems were reviewed and are negative.  Objective: Vital Signs: BP 129/77   Pulse 90   Ht 5' 5.75" (1.67 m)   Wt 138 lb (62.6 kg)   BMI 22.44 kg/m   Physical Exam:  General:  Alert and oriented, in no acute distress. Pulm:  Breathing unlabored. Psy:  Normal mood, congruent affect.  No exam done.  Imaging: No results found.  Assessment & Plan: 1.  Flareup of lupus and myasthenia gravis - she will make dosing changes as discussed, and let me know how she responds.  2.  Hashimoto's thyroiditis -Recheck TSH in about a month.  3.  History of vitamin D deficiency -Recheck levels today.      Procedures: No procedures performed        PMFS History: Patient Active Problem  List   Diagnosis Date Noted  . Asthma 11/08/2019  . Wellness examination 11/08/2019  . 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 Herpes zoster without complication 76/22/6333  . Immunocompromised ([place]) 06/07/2019  . Recurrent major depressive disorder, in full remission ([place]) 06/07/2019  . Discharge from left nipple 04/01/2019  . Mastalgia 04/01/2019  . Jaw pain 08/19/2018  . Myasthenia gravis ([place]) 12/23/2017  . Rectal bleeding 12/23/2017  . Herpes simplex 06/17/2017  . Hashimoto's disease 12/06/2016  . Other acute recurrent sinusitis 12/06/2016  . Vitamin D deficiency 05/31/2016  . Systemic lupus erythematosus ([place]) 10/26/2015  . Hypothyroidism 10/26/2015  . Long-term use of hydroxychloroquine 10/26/2015  . Neck pain 10/26/2015  . Regular astigmatism of both eyes 10/26/2015  . Undifferentiated connective tissue disease ([place]) 12/18/2011   Past Medical History:  Diagnosis Date  . Connective tissue disease ([place])   . Connective tissue disease ([place])    undifferentiated  . Hashimoto's disease   . Ocular myasthenia ([place])   . Systemic lupus ([facility])     Family History  Problem Relation Age of Onset  . Other Mother        brain tumor  .  Cancer - Lung Father     Past Surgical History:  Procedure Laterality Date  . WISDOM TOOTH EXTRACTION     Social History   Occupational History  . Occupation: n    Comment: nanny  Tobacco Use  . Smoking status: Never Smoker  . Smokeless tobacco: Never Used  Substance and Sexual Activity  . Alcohol use: Yes    Comment: 11/27/20 1-3 drinks/ 2-4 nights weekly  . Drug use: No  . Sexual activity: Not on file

## 2020-12-30 LAB — VITAMIN D 25 HYDROXY (VIT D DEFICIENCY, FRACTURES): Vit D, 25-Hydroxy: 83 ng/mL (ref 30–100)

## 2021-01-01 ENCOUNTER — Telehealth: Payer: Self-pay | Admitting: Family Medicine

## 2021-01-01 NOTE — Telephone Encounter (Signed)
D level looks perfect.  No change needed.  [place]

## 2021-01-14 ENCOUNTER — Encounter: Payer: Self-pay | Admitting: Family Medicine

## 2021-01-15 MED ORDER — FLOVENT HFA 44 MCG/ACT IN AERO: INHALATION_SPRAY | RESPIRATORY_TRACT | 12 refills | Status: DC

## 2021-02-13 ENCOUNTER — Telehealth: Payer: Self-pay | Admitting: *Deleted

## 2021-02-13 NOTE — Telephone Encounter (Signed)
Pyridostigmine PA, key: [id number], G70.00.  Per CMM: Member should be able to get the drug/product without a PA at this time. Called [facility], spoke with Tammy and informed her no PA needed, drug covered. . She stated the [facility] they were using had expired, will work on it and call patient.

## 2021-03-24 ENCOUNTER — Encounter: Payer: Self-pay | Admitting: Family Medicine

## 2021-03-26 ENCOUNTER — Encounter: Payer: Self-pay | Admitting: Family Medicine

## 2021-04-17 ENCOUNTER — Ambulatory Visit: Payer: 59 | Admitting: Family Medicine

## 2021-05-01 ENCOUNTER — Ambulatory Visit: Payer: 59 | Admitting: Family Medicine

## 2021-05-03 ENCOUNTER — Encounter: Payer: Self-pay | Admitting: Family Medicine

## 2021-05-04 ENCOUNTER — Ambulatory Visit (INDEPENDENT_AMBULATORY_CARE_PROVIDER_SITE_OTHER): Payer: 59 | Admitting: Family Medicine

## 2021-05-04 ENCOUNTER — Other Ambulatory Visit: Payer: Self-pay

## 2021-05-04 ENCOUNTER — Encounter: Payer: Self-pay | Admitting: Family Medicine

## 2021-05-04 VITALS — BP 95/59 | HR 99 | Ht 65.75 in | Wt 129.0 lb

## 2021-05-04 DIAGNOSIS — E038 Other specified hypothyroidism: Secondary | ICD-10-CM | POA: Diagnosis not present

## 2021-05-04 DIAGNOSIS — M542 Cervicalgia: Secondary | ICD-10-CM | POA: Diagnosis not present

## 2021-05-04 DIAGNOSIS — E063 Autoimmune thyroiditis: Secondary | ICD-10-CM

## 2021-05-04 MED ORDER — THYROID 90 MG PO TABS: ORAL_TABLET | ORAL | 6 refills | Status: DC

## 2021-05-04 MED ORDER — NORETHINDRONE 0.35 MG PO TABS: ORAL_TABLET | ORAL | 3 refills | Status: DC

## 2021-05-04 MED ORDER — BUPROPION HCL ER (SR) 150 MG PO TB12: ORAL_TABLET | ORAL | 3 refills | Status: DC

## 2021-05-04 MED ORDER — MONTELUKAST SODIUM 10 MG PO TABS: 10.0000 mg | ORAL_TABLET | Freq: Every day | ORAL | 3 refills | Status: DC

## 2021-05-04 NOTE — Progress Notes (Signed)
Office Visit Note   Patient: Kathryn Collier           Date of Birth: 01-14-1971           MRN: [id number] Visit Date: 05/04/2021 Requested by: Eunice Blase, MD [place],  [place] [address] PCP: Eunice Blase, MD  Subjective: Chief Complaint  Patient presents with   Neck - Pain    Pain and stiffness in the left side of the neck, catching, since Friday of last week.   Other    Thyroid check -- has lost weight since February's visit: from 138 to 129 lbs.     HPI: She is here with neck pain.  Symptoms started last Friday, left-sided pain with popping when she turns her head.  No radicular symptoms.  This happens to her occasionally and usually responds to acupuncture and other treatments, sometimes physical therapy.  She is also due to have thyroid labs checked.  She has lost a little more than 10 pounds unintentionally.                ROS:   All other systems were reviewed and are negative.  Objective: Vital Signs: BP (!) 95/59 (BP Location: Right Arm, Patient Position: Sitting, Cuff Size: Normal)   Pulse 99   Ht 5' 5.75" (1.67 m)   Wt 129 lb (58.5 kg)   BMI 20.98 kg/m   Physical Exam:  General:  Alert and oriented, in no acute distress. Pulm:  Breathing unlabored. Psy:  Normal mood, congruent affect.  Neck: She has good range of motion but pain at the extremes of rotation to the left.  She has occasional palpable popping in her neck, difficulty to determine exactly where it is coming from.  She has several tender trigger points in the cervical paraspinous muscles especially around C2-3 and C5-6.  There are other tender trigger points in the trapezius and rhomboid area.   Imaging: No results found.  Assessment & Plan: Probable myofascial neck pain, question facet syndrome as well. -Try chiropractic per Dr. Jene Every.  Consider resuming physical therapy dry needling here at our PT with [facility].  2.  Hypothyroidism -Labs today, adjust Armour Thyroid  if needed.  Currently on 90 mg daily.     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/07/2019   Herpes zoster without complication 62/94/7654   Immunocompromised ([place]) 06/07/2019   Recurrent major depressive disorder, in full remission ([place]) 06/07/2019   Discharge from left nipple 04/01/2019   Mastalgia 04/01/2019   Jaw pain 08/19/2018   Myasthenia gravis ([place]) 12/23/2017   Rectal bleeding 12/23/2017   Herpes simplex 06/17/2017   Hashimoto's disease 12/06/2016   Other acute recurrent sinusitis 12/06/2016   Vitamin D deficiency 05/31/2016   Systemic lupus erythematosus ([place]) 10/26/2015   Hypothyroidism 10/26/2015   Long-term use of hydroxychloroquine 10/26/2015   Neck pain 10/26/2015   Regular astigmatism of both eyes 10/26/2015   Undifferentiated connective tissue disease ([place]) 12/18/2011   Past Medical History:  Diagnosis Date   Connective tissue disease ([place])    Connective tissue disease ([place])    undifferentiated   Hashimoto's disease    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

## 2021-05-04 NOTE — Addendum Note (Signed)
Addended by: Hortencia Pilar on: 05/04/2021 04:25 PM   Modules accepted: Orders

## 2021-05-05 LAB — THYROID PANEL WITH TSH
Free Thyroxine Index: 2.1 (ref 1.4–3.8)
T3 Uptake: 33 % (ref 22–35)
T4, Total: 6.3 ug/dL (ref 5.1–11.9)
TSH: 0.73 mIU/L

## 2021-05-07 ENCOUNTER — Telehealth: Payer: Self-pay | Admitting: Family Medicine

## 2021-05-07 NOTE — Telephone Encounter (Signed)
Thyroid labs look perfect.

## 2021-05-09 ENCOUNTER — Ambulatory Visit: Payer: 59 | Admitting: Family Medicine

## 2021-05-15 ENCOUNTER — Ambulatory Visit: Payer: 59 | Admitting: Family Medicine

## 2021-05-22 ENCOUNTER — Ambulatory Visit (INDEPENDENT_AMBULATORY_CARE_PROVIDER_SITE_OTHER): Payer: 59 | Admitting: Family Medicine

## 2021-05-22 ENCOUNTER — Other Ambulatory Visit: Payer: Self-pay

## 2021-05-22 DIAGNOSIS — G7000 Myasthenia gravis without (acute) exacerbation: Secondary | ICD-10-CM | POA: Diagnosis not present

## 2021-05-22 DIAGNOSIS — M542 Cervicalgia: Secondary | ICD-10-CM | POA: Diagnosis not present

## 2021-05-22 NOTE — Progress Notes (Signed)
Office Visit Note   Patient: Kathryn Collier           Date of Birth: 1971-06-25           MRN: [id number] Visit Date: 05/22/2021 Requested by: Eunice Blase, MD [place],  [place] [address] PCP: Eunice Blase, MD  Subjective: Chief Complaint  Patient presents with   Neck - Pain, Follow-up    Has seen the chiropractor a couple of times. Unsure if it is helping, but it is not making it worse. Was doing a different exercise last week - aggravated the neck. She is having some visual changes recently.    HPI: She is here for follow-up neck pain.  She has been to chiropractor twice, she feels good about the progress she has made.  She is not where she needs to be, but overall possibly somewhat better.  She has been doing some water exercises and it tends to aggravate her neck pain.  Lately she has been having some visual issues.  She feels like her eyes are dry.  She may have some change in her visual acuity.  She is not sure if it is an normal part of her myasthenia gravis.  She has had some head congestion recently.  No definite fevers, no purulent nasal discharge.               ROS:   All other systems were reviewed and are negative.  Objective: Vital Signs: There were no vitals taken for this visit.  Physical Exam:  General:  Alert and oriented, in no acute distress. Pulm:  Breathing unlabored. Psy:  Normal mood, congruent affect.  HEENT:  [place]/AT, PERRLA, EOM Full, no nystagmus.  Funduscopic examination within normal limits.  No conjunctival erythema.  Tympanic membranes are pearly gray with normal landmarks.  External ear canals are normal.  Nasal passages are clear.  Oropharynx is clear.  No significant lymphadenopathy.  No thyromegaly or nodules.  2+ carotid pulses without bruits. Neck: Multiple tender trigger points bilaterally.    Imaging: No results found.  Assessment & Plan: Neck pain -Continue with chiropractic.  Could switch to physical therapy for dry  needling if needed.  2.  Vision disturbance -She will try lubricant eye drops more consistently. - Neti pot after swimming. - Eye doctor if worsens.     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/07/2019   Herpes zoster without complication 22/29/7989   Immunocompromised ([place]) 06/07/2019   Recurrent major depressive disorder, in full remission ([place]) 06/07/2019   Discharge from left nipple 04/01/2019   Mastalgia 04/01/2019   Jaw pain 08/19/2018   Myasthenia gravis ([place]) 12/23/2017   Rectal bleeding 12/23/2017   Herpes simplex 06/17/2017   Hashimoto's disease 12/06/2016   Other acute recurrent sinusitis 12/06/2016   Vitamin D deficiency 05/31/2016   Systemic lupus erythematosus ([place]) 10/26/2015   Hypothyroidism 10/26/2015   Long-term use of hydroxychloroquine 10/26/2015   Neck pain 10/26/2015   Regular astigmatism of both eyes 10/26/2015   Undifferentiated connective tissue disease ([place]) 12/18/2011   Past Medical History:  Diagnosis Date   Connective tissue disease ([place])    Connective tissue disease ([place])    undifferentiated   Hashimoto's disease    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

## 2021-06-02 ENCOUNTER — Encounter: Payer: Self-pay | Admitting: Family Medicine

## 2021-07-02 ENCOUNTER — Other Ambulatory Visit: Payer: Self-pay | Admitting: Family Medicine

## 2021-07-03 ENCOUNTER — Telehealth: Payer: Self-pay | Admitting: Family Medicine

## 2021-07-03 NOTE — Telephone Encounter (Signed)
Received email from Roosevelt [place], he only received 3 pages of the records I faxed this am. I emailed records to him-over 30 pages.

## 2022-01-25 ENCOUNTER — Other Ambulatory Visit: Payer: Self-pay | Admitting: Diagnostic Neuroimaging

## 2022-03-06 ENCOUNTER — Ambulatory Visit: Payer: Self-pay | Admitting: Family Medicine

## 2022-05-08 ENCOUNTER — Encounter: Payer: Self-pay | Admitting: Internal Medicine

## 2022-05-08 ENCOUNTER — Ambulatory Visit (INDEPENDENT_AMBULATORY_CARE_PROVIDER_SITE_OTHER): Payer: Medicare Other | Admitting: Internal Medicine

## 2022-05-08 VITALS — BP 106/62 | HR 83 | Temp 98.8°F | Ht 65.75 in | Wt 131.0 lb

## 2022-05-08 DIAGNOSIS — Z1231 Encounter for screening mammogram for malignant neoplasm of breast: Secondary | ICD-10-CM

## 2022-05-08 DIAGNOSIS — F332 Major depressive disorder, recurrent severe without psychotic features: Secondary | ICD-10-CM | POA: Diagnosis not present

## 2022-05-08 DIAGNOSIS — Z0001 Encounter for general adult medical examination with abnormal findings: Secondary | ICD-10-CM | POA: Diagnosis not present

## 2022-05-08 DIAGNOSIS — M797 Fibromyalgia: Secondary | ICD-10-CM | POA: Diagnosis not present

## 2022-05-08 DIAGNOSIS — E038 Other specified hypothyroidism: Secondary | ICD-10-CM | POA: Diagnosis not present

## 2022-05-08 DIAGNOSIS — J45909 Unspecified asthma, uncomplicated: Secondary | ICD-10-CM | POA: Diagnosis not present

## 2022-05-08 DIAGNOSIS — E063 Autoimmune thyroiditis: Secondary | ICD-10-CM | POA: Diagnosis not present

## 2022-05-08 DIAGNOSIS — G894 Chronic pain syndrome: Secondary | ICD-10-CM

## 2022-05-08 DIAGNOSIS — Z1211 Encounter for screening for malignant neoplasm of colon: Secondary | ICD-10-CM

## 2022-05-08 DIAGNOSIS — Z23 Encounter for immunization: Secondary | ICD-10-CM

## 2022-05-08 LAB — BASIC METABOLIC PANEL
BUN: 10 mg/dL (ref 6–23)
CO2: 31 mEq/L (ref 19–32)
Calcium: 9.9 mg/dL (ref 8.4–10.5)
Chloride: 100 mEq/L (ref 96–112)
Creatinine, Ser: 0.76 mg/dL (ref 0.40–1.20)
GFR: 92.6 mL/min (ref >60.00)
Glucose, Bld: 105 mg/dL — ABNORMAL HIGH (ref 70–99)
Potassium: 4.1 mEq/L (ref 3.5–5.1)
Sodium: 139 mEq/L (ref 135–145)

## 2022-05-08 LAB — CBC WITH DIFFERENTIAL/PLATELET
Basophils Absolute: 0 10*3/uL (ref 0.0–0.1)
Basophils Relative: 0.3 % (ref 0.0–3.0)
Eosinophils Absolute: 0 10*3/uL (ref 0.0–0.7)
Eosinophils Relative: 0.4 % (ref 0.0–5.0)
HCT: 41.5 % (ref 36.0–46.0)
Hemoglobin: 14 g/dL (ref 12.0–15.0)
Lymphocytes Relative: 37 % (ref 12.0–46.0)
Lymphs Abs: 2.7 10*3/uL (ref 0.7–4.0)
MCHC: 33.7 g/dL (ref 30.0–36.0)
MCV: 97.1 fl (ref 78.0–100.0)
Monocytes Absolute: 0.7 10*3/uL (ref 0.1–1.0)
Monocytes Relative: 9.2 % (ref 3.0–12.0)
Neutro Abs: 3.8 10*3/uL (ref 1.4–7.7)
Neutrophils Relative %: 53.1 % (ref 43.0–77.0)
Platelets: 238 10*3/uL (ref 150.0–400.0)
RBC: 4.27 Mil/uL (ref 3.87–5.11)
RDW: 13.9 % (ref 11.5–15.5)
WBC: 7.2 10*3/uL (ref 4.0–10.5)

## 2022-05-08 LAB — HEPATIC FUNCTION PANEL
ALT: 19 U/L (ref 0–35)
AST: 24 U/L (ref 0–37)
Albumin: 5 g/dL (ref 3.5–5.2)
Alkaline Phosphatase: 34 U/L — ABNORMAL LOW (ref 39–117)
Bilirubin, Direct: 0.1 mg/dL (ref 0.0–0.3)
Total Bilirubin: 0.6 mg/dL (ref 0.2–1.2)
Total Protein: 7.9 g/dL (ref 6.0–8.3)

## 2022-05-08 LAB — TSH: TSH: 0.87 u[IU]/mL (ref 0.35–5.50)

## 2022-05-08 LAB — CORTISOL: Cortisol, Plasma: 11.9 ug/dL

## 2022-05-08 MED ORDER — DULOXETINE HCL 30 MG PO CPEP: 30.0000 mg | ORAL_CAPSULE | Freq: Every day | ORAL | 0 refills | Status: DC

## 2022-05-08 MED ORDER — FLUTICASONE PROPIONATE HFA 110 MCG/ACT IN AERO: 1.0000 | INHALATION_SPRAY | Freq: Two times a day (BID) | RESPIRATORY_TRACT | 1 refills | Status: DC

## 2022-05-08 NOTE — Progress Notes (Signed)
Subjective:  Patient ID: Kathryn Collier, female    DOB: 03-22-1971  Age: 51 y.o. MRN: [id number]  CC: Hypothyroidism, Depression, Asthma, and Annual Exam   HPI SAYDA GRABLE presents for a CPX and to establish.  She has a history of asthma and requests a refill on the ICS.  She has had some recent episodes of chest tightness but she denies chest pain, wheezing, shortness of breath, or diaphoresis.  She suffers with chronic painful conditions and depression.  She is currently taking Wellbutrin but feels like she needs something in addition.  She has anhedonia, anxiety, fatigue, feeling hopeless and helpless, but she does not feel worthless or suicidal.  History Liticia has a past medical history of Connective tissue disease (HCC), Connective tissue disease (HCC), Hashimoto's disease, Ocular myasthenia (HCC), and Systemic lupus (HCC).   She has a past surgical history that includes Wisdom tooth extraction.   Her family history includes Brain cancer in her mother; Cancer - Lung in her father.She reports that she has never smoked. She has never been exposed to tobacco smoke. She has never used smokeless tobacco. She reports current alcohol use of about 20.0 standard drinks of alcohol per week. She reports current drug use. Drug: Marijuana.  Outpatient Medications Prior to Visit  Medication Sig Dispense Refill   b complex vitamins capsule Take by mouth.     Bromelains 500 MG TABS Take by mouth 3 (three) times daily.     buPROPion (WELLBUTRIN SR) 150 MG 12 hr tablet TAKE 1 TABLET(150 MG) BY MOUTH TWICE DAILY 180 tablet 3   Cholecalciferol 25 MCG (1000 UT) tablet Take by mouth.     cyclobenzaprine (FLEXERIL) 10 MG tablet Take 10 mg by mouth 3 (three) times daily as needed for muscle spasms.     diclofenac (VOLTAREN) 75 MG EC tablet Take 75 mg by mouth 2 (two) times daily.     hydroxychloroquine (PLAQUENIL) 200 MG tablet Take by mouth 2 (two) times daily.     montelukast (SINGULAIR) 10 MG tablet  Take 1 tablet (10 mg total) by mouth at bedtime. 90 tablet 3   NON FORMULARY      NON FORMULARY      NON FORMULARY 500 mg in the morning and at bedtime.     NON FORMULARY daily.     norethindrone (NORLYDA) 0.35 MG tablet TAKE 1 TABLET(0.35 MG) BY MOUTH DAILY 90 tablet 3   Omega-3 1000 MG CAPS Take by mouth.     OVER THE COUNTER MEDICATION      pyridostigmine (MESTINON) 60 MG tablet Take 0.5-1 tablets (30-60 mg total) by mouth 3 (three) times daily. 90 tablet 12   thyroid (ARMOUR THYROID) 90 MG tablet TAKE 1 TABLET BY MOUTH EVERY DAY BEFORE BREAKFAST 30 tablet 6   fluticasone (FLOVENT HFA) 44 MCG/ACT inhaler 2 Puffs BID x 2 months, then 1 Puff BID x 2 months, then stop 1 each 12   No facility-administered medications prior to visit.    ROS Review of Systems  Constitutional:  Positive for fatigue. Negative for chills, diaphoresis and unexpected weight change.  HENT: Negative.  Negative for sore throat.   Eyes: Negative.   Respiratory:  Positive for chest tightness. Negative for cough, shortness of breath and wheezing.   Cardiovascular:  Negative for chest pain, palpitations and leg swelling.  Gastrointestinal:  Negative for abdominal pain, constipation, diarrhea, nausea and vomiting.  Endocrine: Negative.   Genitourinary: Negative.  Negative for difficulty urinating.  Musculoskeletal:  Positive for arthralgias and myalgias. Negative for gait problem, joint swelling, neck pain and neck stiffness.  Skin: Negative.  Negative for color change and pallor.  Neurological:  Negative for dizziness, weakness, light-headedness and headaches.  Hematological:  Negative for adenopathy. Does not bruise/bleed easily.  Psychiatric/Behavioral:  Positive for dysphoric mood. Negative for agitation, behavioral problems, confusion, decreased concentration, sleep disturbance and suicidal ideas. The patient is nervous/anxious.     Objective:  BP 106/62 (BP Location: Right Arm, Patient Position: Sitting, Cuff  Size: Large)   Pulse 83   Temp 98.8 F (37.1 C) (Oral)   Ht 5' 5.75" (1.67 m)   Wt 131 lb (59.4 kg)   SpO2 99%   BMI 21.31 kg/m   Physical Exam Vitals reviewed.  Constitutional:      Appearance: Normal appearance.  HENT:     Nose: Nose normal.     Mouth/Throat:     Mouth: Mucous membranes are moist.  Eyes:     General: No scleral icterus.    Conjunctiva/sclera: Conjunctivae normal.  Cardiovascular:     Rate and Rhythm: Normal rate and regular rhythm.     Heart sounds: No murmur heard.    No friction rub. No gallop.  Pulmonary:     Effort: Pulmonary effort is normal.     Breath sounds: No stridor. No wheezing, rhonchi or rales.  Abdominal:     General: Abdomen is flat.     Palpations: There is no mass.     Tenderness: There is no abdominal tenderness. There is no guarding.     Hernia: No hernia is present.  Musculoskeletal:        General: No swelling or deformity.     Cervical back: Neck supple.     Right lower leg: No edema.     Left lower leg: No edema.  Lymphadenopathy:     Cervical: No cervical adenopathy.  Skin:    General: Skin is warm and dry.     Coloration: Skin is not pale.  Neurological:     General: No focal deficit present.     Mental Status: She is alert. Mental status is at baseline.  Psychiatric:        Attention and Perception: Attention and perception normal.        Mood and Affect: Mood is anxious and depressed. Affect is flat and tearful.        Speech: Speech normal. She is communicative. Speech is not delayed or tangential.        Behavior: Behavior normal. Behavior is cooperative.        Thought Content: Thought content normal. Thought content is not paranoid or delusional. Thought content does not include homicidal or suicidal ideation.        Cognition and Memory: Cognition normal.     Lab Results  Component Value Date   WBC 7.2 05/08/2022   HGB 14.0 05/08/2022   HCT 41.5 05/08/2022   PLT 238.0 05/08/2022   GLUCOSE 105 (H)  05/08/2022   ALT 19 05/08/2022   AST 24 05/08/2022   NA 139 05/08/2022   K 4.1 05/08/2022   CL 100 05/08/2022   CREATININE 0.76 05/08/2022   BUN 10 05/08/2022   CO2 31 05/08/2022   TSH 0.87 05/08/2022     Assessment & Plan:   Sheenah was seen today for hypothyroidism, depression, asthma and annual exam.  Diagnoses and all orders for this visit:  Severe episode of recurrent major depressive disorder, without psychotic features (  HCC)- I recommended that she add an SNRI to bupropion. -     Basic metabolic panel; Future -     TSH; Future -     Hepatic function panel; Future -     Cortisol; Future -     Cortisol -     Hepatic function panel -     TSH -     Basic metabolic panel -     DULoxetine (CYMBALTA) 30 MG capsule; Take 1 capsule (30 mg total) by mouth daily.  Hypothyroidism due to Hashimoto's thyroiditis- She is euthyroid. -     Basic metabolic panel; Future -     TSH; Future -     Hepatic function panel; Future -     Cortisol; Future -     Cortisol -     Hepatic function panel -     TSH -     Basic metabolic panel  Uncomplicated asthma, unspecified asthma severity, unspecified whether persistent -     Basic metabolic panel; Future -     CBC with Differential/Platelet; Future -     Hepatic function panel; Future -     Cortisol; Future -     Cortisol -     Hepatic function panel -     CBC with Differential/Platelet -     Basic metabolic panel -     fluticasone (FLOVENT HFA) 110 MCG/ACT inhaler; Inhale 1 puff into the lungs 2 (two) times daily.  Fibromyalgia -     DULoxetine (CYMBALTA) 30 MG capsule; Take 1 capsule (30 mg total) by mouth daily.  Chronic pain disorder -     DULoxetine (CYMBALTA) 30 MG capsule; Take 1 capsule (30 mg total) by mouth daily.  Screen for colon cancer -     Cologuard  Screening mammogram for breast cancer -     MM DIGITAL SCREENING BILATERAL; Future   I have discontinued Blu L. Belfield's Flovent HFA. I am also having her start  on fluticasone and DULoxetine. Additionally, I am having her maintain her hydroxychloroquine, diclofenac, Cholecalciferol, b complex vitamins, Bromelains, Omega-3, NON FORMULARY, NON FORMULARY, OVER THE COUNTER MEDICATION, NON FORMULARY, cyclobenzaprine, NON FORMULARY, pyridostigmine, montelukast, buPROPion, thyroid, and norethindrone.  Meds ordered this encounter  Medications   fluticasone (FLOVENT HFA) 110 MCG/ACT inhaler    Sig: Inhale 1 puff into the lungs 2 (two) times daily.    Dispense:  3 each    Refill:  1   DULoxetine (CYMBALTA) 30 MG capsule    Sig: Take 1 capsule (30 mg total) by mouth daily.    Dispense:  30 capsule    Refill:  0     Follow-up: Return in about 6 months (around 11/08/2022).  Sanda Linger, MD

## 2022-05-08 NOTE — Patient Instructions (Signed)
Systemic Lupus Erythematosus, Adult Systemic lupus erythematosus (SLE) is a long-term (chronic) disease that can affect many parts of the body. SLE is an autoimmune disease. With this type of disease, the body's defense system (immune system) mistakenly attacks healthy tissues. This can cause damage to the skin, joints, blood vessels, brain, kidneys, lungs, heart, and other internal organs. It causes pain, irritation, and inflammation. What are the causes? The cause of this condition is not known. What increases the risk? The following factors may make you more likely to develop this condition: Being female. Being of Asian, Hispanic, or African American descent. Having a family history of the condition. Being exposed to tobacco smoke or smoking cigarettes. Having an infection with a virus, such as Epstein-Barr virus. Having a history of exposure to silica dust, metals, chemicals, mold or mildew, or insecticides. Using oral contraceptives or hormone replacement therapy. What are the signs or symptoms? This condition can affect almost any organ or system in the body. Symptoms of the condition depend on which organ or system is affected. The most common symptoms include: Fever. Tiredness (fatigue). Weight loss. Muscle aches. Joint pain. Skin rashes, especially over the nose and cheeks (butterfly rash) and after sun exposure. Symptoms can come and go. A period of time when symptoms get worse or come back is called a flare. A period of time with no symptoms is called a remission. How is this diagnosed? This condition is diagnosed based on: Your symptoms. Your medical history. A physical exam. You may also have tests, including: Blood tests. Urine tests. A chest X-ray. You may be referred to an autoimmune disease specialist (rheumatologist). How is this treated? There is no cure for this condition, but treatment can help to control symptoms, prevent flares (keep symptoms in remission),  and prevent damage to the heart, lungs, kidneys, and other organs. Treatment will depend on what symptoms you are having and what organs or systems are affected. Treatment may involve taking a combination of medicines over time. Common medicines used to treat this condition include: Antimalarial medicines to control symptoms, prevent flares, and protect against organ damage. Corticosteroids and NSAIDs to reduce inflammation. Medicines to weaken your immune system (immunosuppressants). Biologic response modifiers to reduce inflammation and damage. Follow these instructions at home: Medicines Take over-the-counter and prescription medicines only as told by your health care provider. Do not take any medicines that contain estrogen without first checking with your health care provider. Estrogen can trigger flares and may increase your risk for blood clots. Eating and drinking Eat a heart-healthy diet. This may include: Eating high-fiber foods, such as beans, whole grains, and fresh fruits and vegetables. Eating heart-healthy fats (omega-3 fats), such as fish, flaxseed, and flaxseed oil. Limiting foods that are high in saturated fat and cholesterol, such as processed and fried foods, fatty meat, and full-fat dairy. Limiting how much salt (sodium) you eat. Include calcium and vitamin D in your diet. Good sources of calcium and vitamin D include: Low-fat dairy products such as milk, yogurt, and cheese. Certain fish, such as fresh or canned salmon, tuna, and sardines. Products that have calcium and vitamin D added to them (are fortified), such as fortified cereals or juice. Lifestyle     Stay active, as directed by your health care provider. Do not use any products that contain nicotine or tobacco. These products include cigarettes, chewing tobacco, and vaping devices, such as e-cigarettes. If you need help quitting, ask your health care provider. Protect your skin from the sun by   applying sunblock  and wearing protective hats and clothing. Learn as much as you can about your condition and have a good support system in place. Support may come from family, friends, or a lupus support group. General instructions Work closely with all of your health care providers to manage your condition. Stay up to date on all vaccines as told by your health care provider. Keep all follow-up visits. This is important. Contact a health care provider if: You have a fever. Your symptoms flare. You develop new symptoms. You have an upper respiratory infection. You have bloody, foamy, or coffee-colored urine. There are changes in your urination. For example, you urinate more often at night. You think that you may be depressed or have anxiety. You become pregnant or plan to become pregnant. Pregnancy in women with this condition is considered high risk. Get help right away if: You have chest pain. You have trouble breathing. You have a seizure. You suddenly get a very bad headache. You suddenly develop facial or body weakness. You cannot speak. You cannot understand speech. These symptoms may be an emergency. Get help right away. Call 911. Do not wait to see if the symptoms will go away. Do not drive yourself to the hospital. Summary Systemic lupus erythematosus (SLE) is a long-term disease that can affect many parts of the body. SLE is an autoimmune disease. That means your body's defense system (immune system) mistakenly attacks healthy tissues. There is no cure for this condition, but treatment can help to control symptoms, prevent flares, and prevent damage to your organs. Treatment may involve taking a combination of medicines over time. This information is not intended to replace advice given to you by your health care provider. Make sure you discuss any questions you have with your health care provider. Document Revised: 07/27/2021 Document Reviewed: 07/27/2021 Elsevier Patient Education  2023  [employer]    

## 2022-05-09 DIAGNOSIS — Z1231 Encounter for screening mammogram for malignant neoplasm of breast: Secondary | ICD-10-CM | POA: Insufficient documentation

## 2022-05-09 DIAGNOSIS — G894 Chronic pain syndrome: Secondary | ICD-10-CM | POA: Insufficient documentation

## 2022-05-09 DIAGNOSIS — Z1211 Encounter for screening for malignant neoplasm of colon: Secondary | ICD-10-CM | POA: Insufficient documentation

## 2022-05-09 DIAGNOSIS — M797 Fibromyalgia: Secondary | ICD-10-CM | POA: Insufficient documentation

## 2022-05-10 ENCOUNTER — Encounter: Payer: Self-pay | Admitting: Internal Medicine

## 2022-05-10 DIAGNOSIS — Z30011 Encounter for initial prescription of contraceptive pills: Secondary | ICD-10-CM | POA: Insufficient documentation

## 2022-05-10 DIAGNOSIS — Z0001 Encounter for general adult medical examination with abnormal findings: Secondary | ICD-10-CM | POA: Insufficient documentation

## 2022-05-10 NOTE — Assessment & Plan Note (Signed)
Exam completed. Labs reviewed-statin therapy is not indicated. Vaccines reviewed and updated. Cancer screenings addressed. Patient education was given.

## 2022-05-17 DIAGNOSIS — Z23 Encounter for immunization: Secondary | ICD-10-CM | POA: Diagnosis not present

## 2022-05-17 NOTE — Addendum Note (Signed)
Addended by: Darryll Capers on: 05/17/2022 09:33 AM   Modules accepted: Orders

## 2022-05-20 ENCOUNTER — Encounter: Payer: Self-pay | Admitting: Internal Medicine

## 2022-05-22 LAB — COLOGUARD: COLOGUARD: NEGATIVE

## 2022-06-03 ENCOUNTER — Encounter: Payer: Self-pay | Admitting: Internal Medicine

## 2022-06-03 ENCOUNTER — Other Ambulatory Visit: Payer: Self-pay | Admitting: Internal Medicine

## 2022-06-03 DIAGNOSIS — F3342 Major depressive disorder, recurrent, in full remission: Secondary | ICD-10-CM

## 2022-06-03 DIAGNOSIS — G894 Chronic pain syndrome: Secondary | ICD-10-CM

## 2022-06-03 DIAGNOSIS — M797 Fibromyalgia: Secondary | ICD-10-CM

## 2022-06-03 MED ORDER — DULOXETINE HCL 60 MG PO CPEP: 60.0000 mg | ORAL_CAPSULE | Freq: Every day | ORAL | 0 refills | Status: DC

## 2022-06-24 ENCOUNTER — Encounter: Payer: Self-pay | Admitting: Internal Medicine

## 2022-06-25 ENCOUNTER — Other Ambulatory Visit: Payer: Self-pay | Admitting: Internal Medicine

## 2022-06-25 DIAGNOSIS — F3342 Major depressive disorder, recurrent, in full remission: Secondary | ICD-10-CM

## 2022-06-25 DIAGNOSIS — F332 Major depressive disorder, recurrent severe without psychotic features: Secondary | ICD-10-CM

## 2022-06-25 DIAGNOSIS — G894 Chronic pain syndrome: Secondary | ICD-10-CM

## 2022-06-25 DIAGNOSIS — M797 Fibromyalgia: Secondary | ICD-10-CM

## 2022-06-25 MED ORDER — DULOXETINE HCL 30 MG PO CPEP: 30.0000 mg | ORAL_CAPSULE | Freq: Every day | ORAL | 0 refills | Status: DC

## 2022-07-09 ENCOUNTER — Encounter: Payer: Self-pay | Admitting: Internal Medicine

## 2022-07-13 ENCOUNTER — Encounter: Payer: Self-pay | Admitting: Internal Medicine

## 2022-07-15 ENCOUNTER — Other Ambulatory Visit: Payer: Self-pay | Admitting: Internal Medicine

## 2022-07-15 DIAGNOSIS — Z30011 Encounter for initial prescription of contraceptive pills: Secondary | ICD-10-CM

## 2022-07-15 MED ORDER — NORETHINDRONE 0.35 MG PO TABS: ORAL_TABLET | ORAL | 0 refills | Status: DC

## 2022-07-23 ENCOUNTER — Encounter: Payer: Self-pay | Admitting: Internal Medicine

## 2022-07-23 ENCOUNTER — Ambulatory Visit (INDEPENDENT_AMBULATORY_CARE_PROVIDER_SITE_OTHER): Payer: Medicare Other | Admitting: Internal Medicine

## 2022-07-23 VITALS — BP 118/72 | HR 91 | Temp 98.4°F | Ht 65.0 in | Wt 128.0 lb

## 2022-07-23 DIAGNOSIS — E038 Other specified hypothyroidism: Secondary | ICD-10-CM

## 2022-07-23 DIAGNOSIS — E063 Autoimmune thyroiditis: Secondary | ICD-10-CM | POA: Diagnosis not present

## 2022-07-23 NOTE — Progress Notes (Unsigned)
Subjective:  Patient ID: Kathryn Collier, female    DOB: 07/08/1971  Age: 51 y.o. MRN: [id number]  CC: Hypothyroidism   HPI JAIDAH LOMAX presents for f/up -   She feels well on the current dose of duloxetine and would like to continue taking it.  She thinks it has improved her energy.  She is not eating much food and therefore losing weight because she has general stiffness associated with connective tissue disease.  Outpatient Medications Prior to Visit  Medication Sig Dispense Refill   b complex vitamins capsule Take by mouth.     Bromelains 500 MG TABS Take by mouth 3 (three) times daily.     buPROPion (WELLBUTRIN SR) 150 MG 12 hr tablet TAKE 1 TABLET(150 MG) BY MOUTH TWICE DAILY 180 tablet 3   Cholecalciferol 25 MCG (1000 UT) tablet Take by mouth.     cyclobenzaprine (FLEXERIL) 10 MG tablet Take 10 mg by mouth 3 (three) times daily as needed for muscle spasms.     diclofenac (VOLTAREN) 75 MG EC tablet Take 75 mg by mouth 2 (two) times daily.     DULoxetine (CYMBALTA) 30 MG capsule Take 1 capsule (30 mg total) by mouth daily. 90 capsule 0   DULoxetine (CYMBALTA) 60 MG capsule Take 1 capsule (60 mg total) by mouth daily. 90 capsule 0   fluticasone (FLOVENT HFA) 110 MCG/ACT inhaler Inhale 1 puff into the lungs 2 (two) times daily. 3 each 1   hydroxychloroquine (PLAQUENIL) 200 MG tablet Take by mouth 2 (two) times daily.     montelukast (SINGULAIR) 10 MG tablet Take 1 tablet (10 mg total) by mouth at bedtime. 90 tablet 3   NON FORMULARY      NON FORMULARY      NON FORMULARY 500 mg in the morning and at bedtime.     NON FORMULARY daily.     norethindrone (NORLYDA) 0.35 MG tablet TAKE 1 TABLET(0.35 MG) BY MOUTH DAILY 90 tablet 0   Omega-3 1000 MG CAPS Take by mouth.     OVER THE COUNTER MEDICATION      pyridostigmine (MESTINON) 60 MG tablet Take 0.5-1 tablets (30-60 mg total) by mouth 3 (three) times daily. 90 tablet 12   thyroid (ARMOUR THYROID) 90 MG tablet TAKE 1 TABLET BY MOUTH  EVERY DAY BEFORE BREAKFAST 30 tablet 6   No facility-administered medications prior to visit.    ROS Review of Systems  Constitutional:  Positive for fatigue. Negative for chills, diaphoresis and unexpected weight change.  HENT: Negative.    Eyes: Negative.   Respiratory:  Negative for cough.   Cardiovascular:  Negative for chest pain, palpitations and leg swelling.  Gastrointestinal:  Negative for abdominal pain, constipation, diarrhea and nausea.  Endocrine: Negative.   Genitourinary: Negative.  Negative for difficulty urinating.  Musculoskeletal:  Positive for myalgias. Negative for arthralgias and back pain.  Skin: Negative.   Neurological: Negative.   Hematological:  Negative for adenopathy. Does not bruise/bleed easily.  Psychiatric/Behavioral:  Positive for dysphoric mood and sleep disturbance. Negative for self-injury and suicidal ideas. The patient is nervous/anxious.     Objective:  BP 118/72 (BP Location: Left Arm, Patient Position: Sitting, Cuff Size: Normal)   Pulse 91   Temp 98.4 F (36.9 C) (Oral)   Ht 5\' 5"  (1.651 m)   Wt 128 lb (58.1 kg)   SpO2 98%   BMI 21.30 kg/m   BP Readings from Last 3 Encounters:  07/23/22 118/72  05/08/22 106/62  05/04/21 (!) 95/59    Wt Readings from Last 3 Encounters:  07/23/22 128 lb (58.1 kg)  05/08/22 131 lb (59.4 kg)  05/04/21 129 lb (58.5 kg)    Physical Exam Vitals reviewed.  HENT:     Mouth/Throat:     Mouth: Mucous membranes are moist.  Eyes:     General: No scleral icterus.    Conjunctiva/sclera: Conjunctivae normal.  Cardiovascular:     Rate and Rhythm: Normal rate and regular rhythm.     Heart sounds: No murmur heard. Pulmonary:     Effort: Pulmonary effort is normal.     Breath sounds: No stridor. No wheezing, rhonchi or rales.  Abdominal:     Palpations: There is no mass.     Tenderness: There is no abdominal tenderness. There is no guarding.     Hernia: No hernia is present.  Musculoskeletal:         General: Normal range of motion.     Cervical back: Neck supple.     Right lower leg: No edema.     Left lower leg: No edema.  Lymphadenopathy:     Cervical: No cervical adenopathy.  Skin:    General: Skin is warm and dry.  Neurological:     General: No focal deficit present.     Mental Status: She is alert.  Psychiatric:        Mood and Affect: Mood normal.        Behavior: Behavior normal.     Lab Results  Component Value Date   WBC 7.2 05/08/2022   HGB 14.0 05/08/2022   HCT 41.5 05/08/2022   PLT 238.0 05/08/2022   GLUCOSE 105 (H) 05/08/2022   ALT 19 05/08/2022   AST 24 05/08/2022   NA 139 05/08/2022   K 4.1 05/08/2022   CL 100 05/08/2022   CREATININE 0.76 05/08/2022   BUN 10 05/08/2022   CO2 31 05/08/2022   TSH 0.87 05/08/2022    MR Shoulder Left w/o contrast  Result Date: 10/24/2020 CLINICAL DATA:  Left shoulder pain with cracking and crunching for 10 years. Left arm weakness. No acute injury or prior relevant surgery. EXAM: MRI OF THE LEFT SHOULDER WITHOUT CONTRAST TECHNIQUE: Multiplanar, multisequence MR imaging of the shoulder was performed. No intravenous contrast was administered. COMPARISON:  Limited correlation made with a chest CT 06/24/2017. FINDINGS: Rotator cuff: Mild supraspinatus and infraspinatus tendinosis without tear. The subscapularis and teres minor tendons appear normal. Muscles:  No focal muscular atrophy or edema. Biceps long head:  Intact and normally positioned. Acromioclavicular Joint: The acromion is type 2. There are mild acromioclavicular degenerative changes. No significant fluid is present in the subacromial - subdeltoid bursa. Glenohumeral Joint: No significant shoulder joint effusion or glenohumeral arthropathy. Labrum: Labral assessment is limited by the lack of joint fluid. No evidence of labral tear or paralabral cyst. Bones: No acute or significant extra-articular osseous findings.Minimal subcortical cyst formation in the humeral head  near the infraspinatus insertion. Other: Multiple prominent lymph nodes are noted within the left axilla and subpectoral space, similar to previous chest CT. On previous CT, these were present bilaterally. IMPRESSION: 1. Mild supraspinatus and infraspinatus tendinosis. No evidence of rotator cuff tear. 2. The labrum and biceps tendon appear intact. 3. Mild acromioclavicular degenerative changes. 4. Multiple prominent lymph nodes within the left axilla and subpectoral space, similar to previous chest CT and presumably benign/reactive. Electronically Signed   By: Carey Bullocks M.D.   On: 10/24/2020 09:45  Assessment & Plan:   Jorden was seen today for hypothyroidism.  Diagnoses and all orders for this visit:  Hypothyroidism due to Hashimoto's thyroiditis- She is euthyroid.   I am having Kevin L. Mogle maintain her hydroxychloroquine, diclofenac, Cholecalciferol, b complex vitamins, Bromelains, Omega-3, NON FORMULARY, NON FORMULARY, OVER THE COUNTER MEDICATION, NON FORMULARY, cyclobenzaprine, NON FORMULARY, pyridostigmine, montelukast, buPROPion, thyroid, fluticasone, DULoxetine, DULoxetine, and norethindrone.  No orders of the defined types were placed in this encounter.    Follow-up: Return in about 6 months (around 01/21/2023).  Sanda Linger, MD

## 2022-07-25 NOTE — Patient Instructions (Signed)

## 2022-07-31 IMAGING — MR MR SHOULDER*L* W/O CM
5 series · 37 of 40 positions shown · non-contrast
Comparison: Limited correlation made with a chest CT 06/24/2017.

CLINICAL DATA: Left shoulder pain with cracking and crunching for
10 years. Left arm weakness. No acute injury or prior relevant
surgery.

EXAM:
MRI OF THE LEFT SHOULDER WITHOUT CONTRAST
TECHNIQUE: Multiplanar, multisequence MR imaging of the shoulder was performed.
No intravenous contrast was administered.

[Series 3: T2 fat-sat · axial · 4.0mm · 0.27mm/px · z∈[-56,+89]mm · 11 of 30 slices shown (1 of 3)]
[im 1/30]
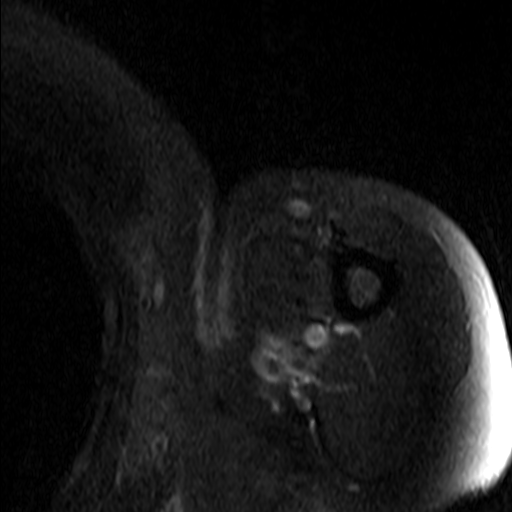
[im 3/30]
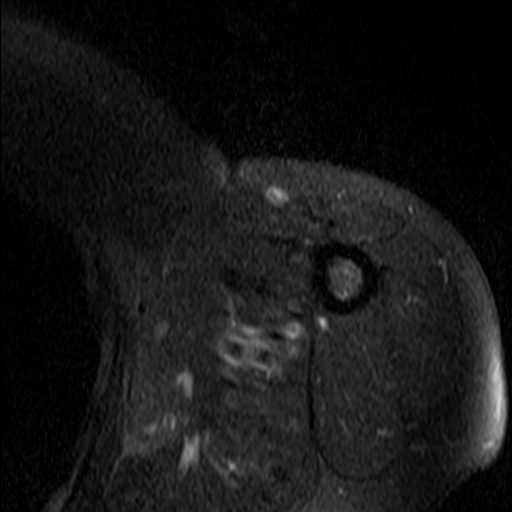
[im 6/30]
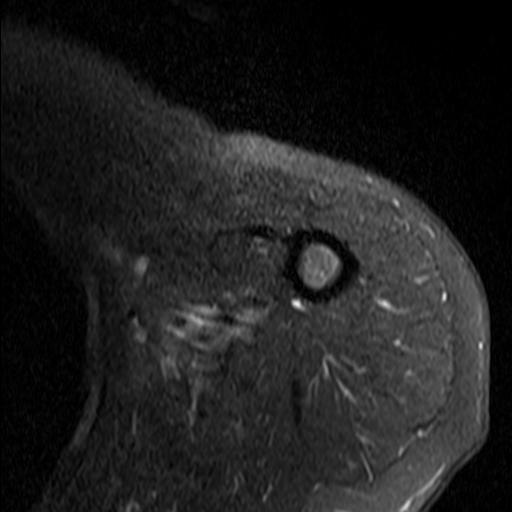
[im 9/30]
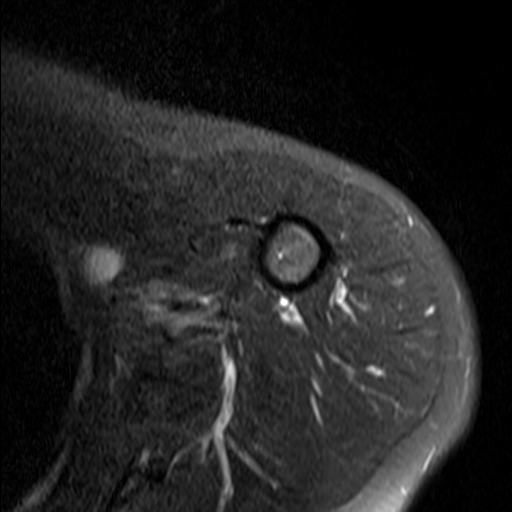
[im 12/30]
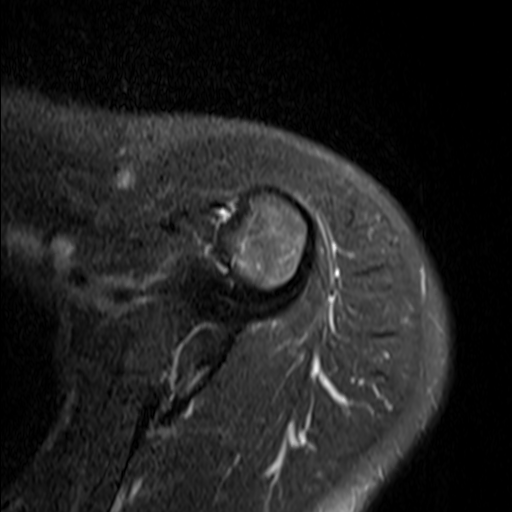
[im 15/30]
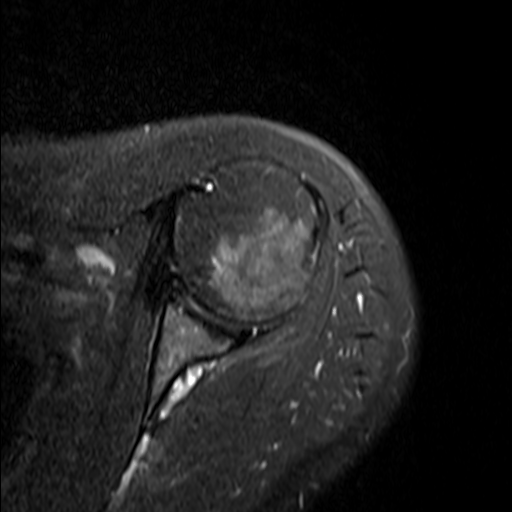
[im 18/30]
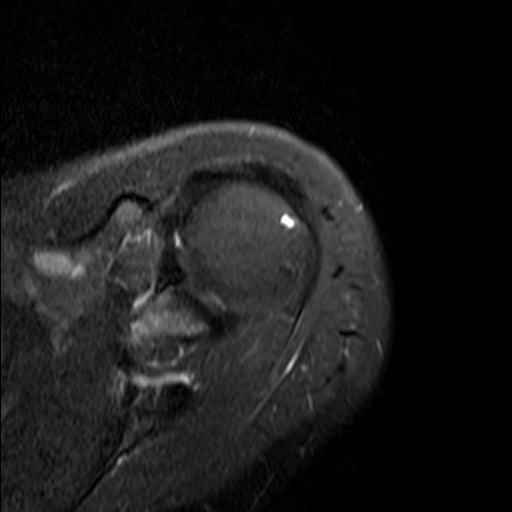
[im 21/30]
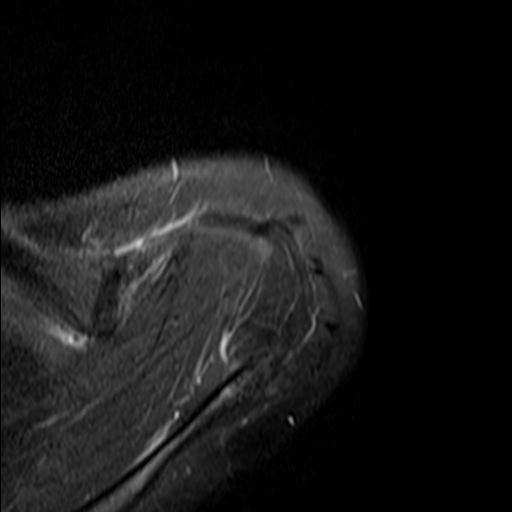
[im 24/30]
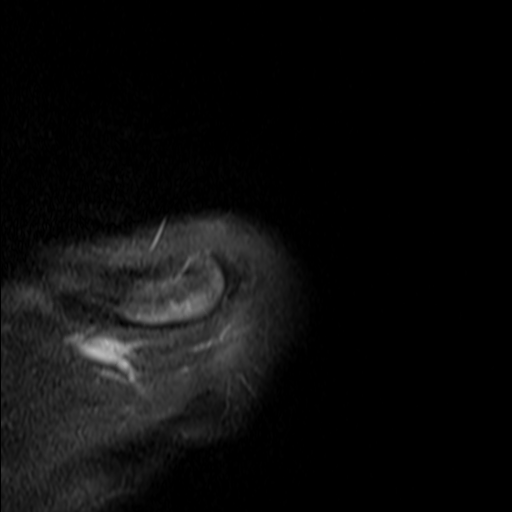
[im 27/30]
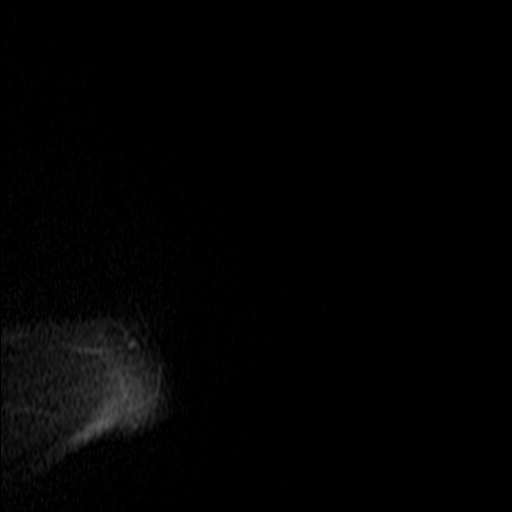
[im 30/30]
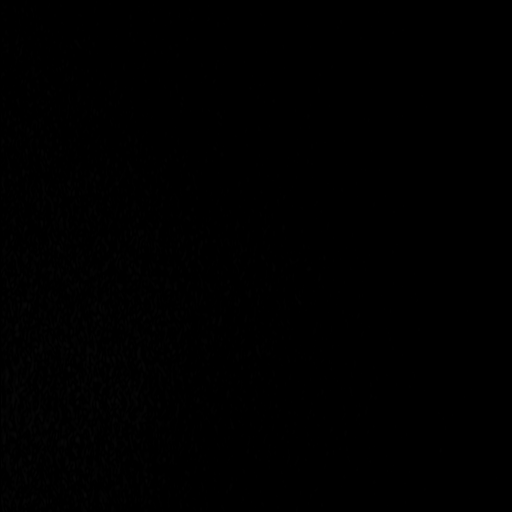

[Series 4: T2 fat-sat · oblique · 4.0mm · 0.59mm/px · 7 of 20 slices shown (2 of 3)]
[im 1/20]
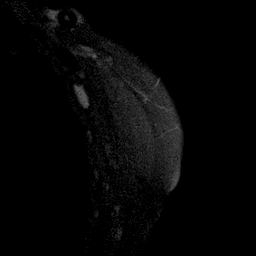
[im 4/20]
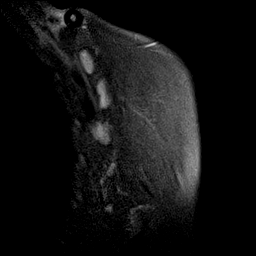
[im 7/20]
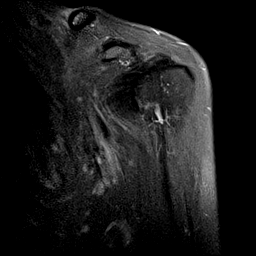
[im 10/20]
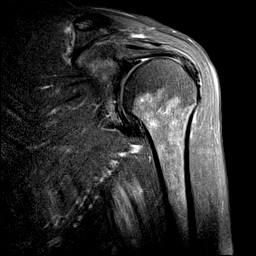
[im 13/20]
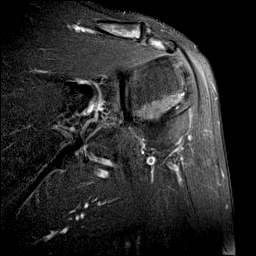
[im 16/20]
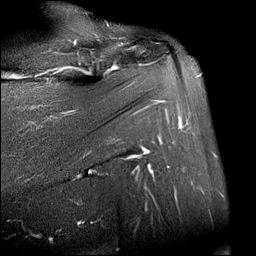
[im 20/20]
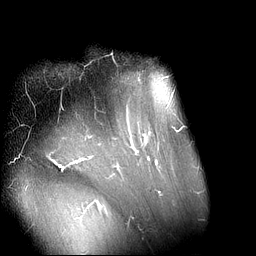

[Series 5: PD · oblique · 4.0mm · 0.59mm/px · 7 of 20 slices shown]
[im 1/20]
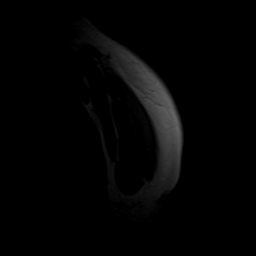
[im 4/20]
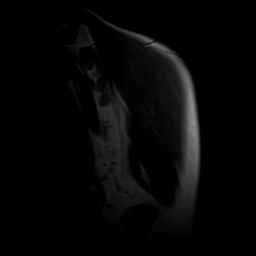
[im 7/20]
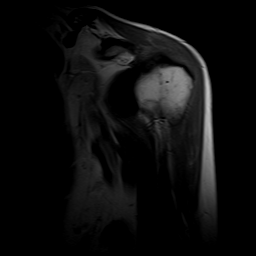
[im 10/20]
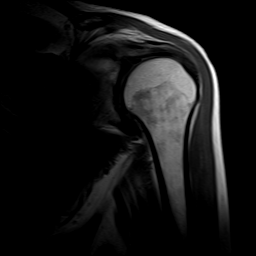
[im 13/20]
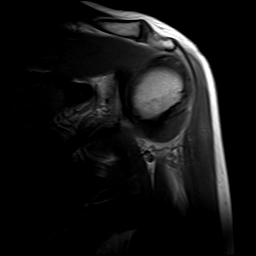
[im 16/20]
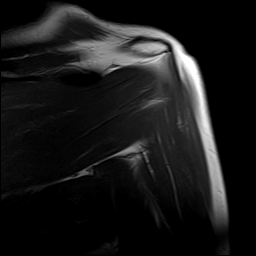
[im 20/20]
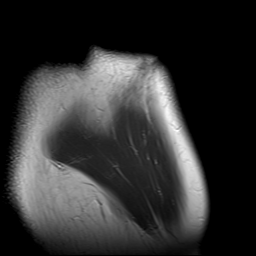

[Series 6: T2 fat-sat · oblique · 4.0mm · 0.55mm/px · 7 of 21 slices shown (3 of 3)]
[im 1/21]
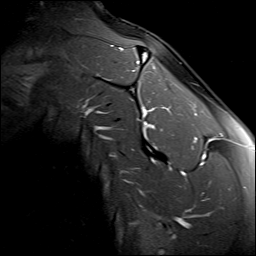
[im 4/21]
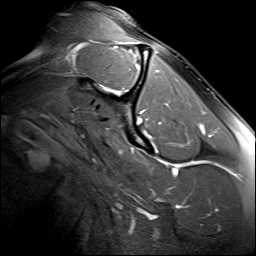
[im 7/21]
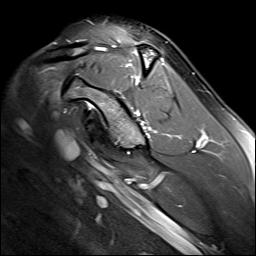
[im 11/21]
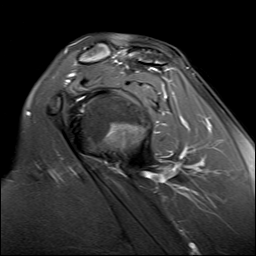
[im 14/21]
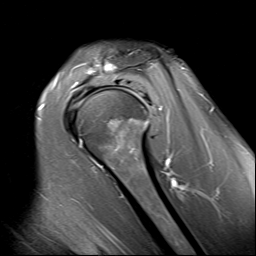
[im 17/21]
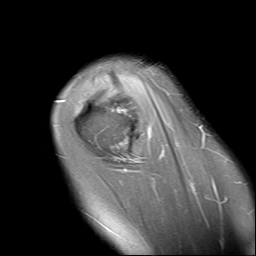
[im 21/21]
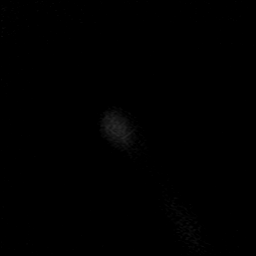

[Series 7: T1 · oblique · 4.0mm · 0.59mm/px · 5 of 22 slices shown]
[im 1/22]
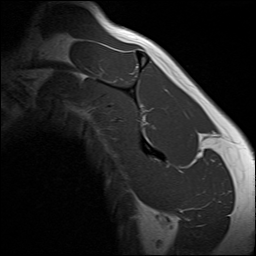
[im 4/22]
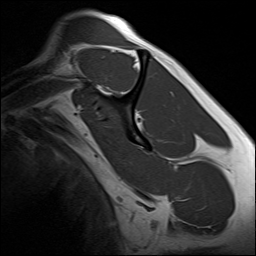
[im 7/22]
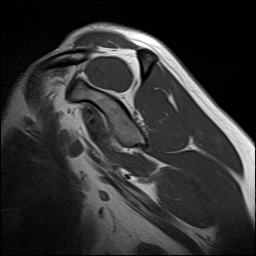
[im 10/22]
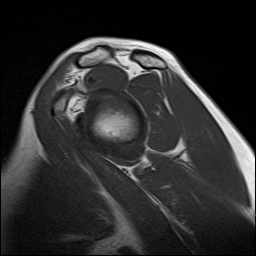
[im 13/22]
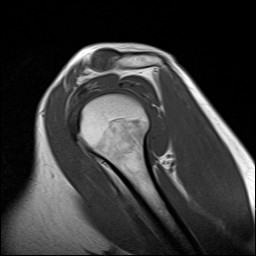

[37 of 40 positions shown; findings below may reference images not displayed]

FINDINGS: Rotator cuff: Mild supraspinatus and infraspinatus tendinosis
without tear. The subscapularis and teres minor tendons appear
normal.

Muscles:  No focal muscular atrophy or edema.

Biceps long head:  Intact and normally positioned.

Acromioclavicular Joint: The acromion is type 2. There are mild
acromioclavicular degenerative changes. No significant fluid is
present in the subacromial - subdeltoid bursa.

Glenohumeral Joint: No significant shoulder joint effusion or
glenohumeral arthropathy.

Labrum: Labral assessment is limited by the lack of joint fluid. No
evidence of labral tear or paralabral cyst.

Bones: No acute or significant extra-articular osseous
findings.Minimal subcortical cyst formation in the humeral head near
the infraspinatus insertion.

Other: Multiple prominent lymph nodes are noted within the left
axilla and subpectoral space, similar to previous chest CT. On
previous CT, these were present bilaterally.
IMPRESSION: 1. Mild supraspinatus and infraspinatus tendinosis. No evidence of
rotator cuff tear.
2. The labrum and biceps tendon appear intact.
3. Mild acromioclavicular degenerative changes.
4. Multiple prominent lymph nodes within the left axilla and
subpectoral space, similar to previous chest CT and presumably
benign/reactive.

## 2022-08-30 ENCOUNTER — Other Ambulatory Visit: Payer: Self-pay | Admitting: Internal Medicine

## 2022-08-30 DIAGNOSIS — M797 Fibromyalgia: Secondary | ICD-10-CM

## 2022-08-30 DIAGNOSIS — G894 Chronic pain syndrome: Secondary | ICD-10-CM

## 2022-08-30 DIAGNOSIS — F3342 Major depressive disorder, recurrent, in full remission: Secondary | ICD-10-CM

## 2022-09-11 ENCOUNTER — Other Ambulatory Visit: Payer: Self-pay | Admitting: Internal Medicine

## 2022-09-11 ENCOUNTER — Telehealth: Payer: Self-pay | Admitting: Internal Medicine

## 2022-09-11 DIAGNOSIS — M797 Fibromyalgia: Secondary | ICD-10-CM

## 2022-09-11 DIAGNOSIS — F332 Major depressive disorder, recurrent severe without psychotic features: Secondary | ICD-10-CM

## 2022-09-11 DIAGNOSIS — G894 Chronic pain syndrome: Secondary | ICD-10-CM

## 2022-09-11 DIAGNOSIS — F3342 Major depressive disorder, recurrent, in full remission: Secondary | ICD-10-CM

## 2022-09-11 MED ORDER — DULOXETINE HCL 30 MG PO CPEP: 30.0000 mg | ORAL_CAPSULE | Freq: Every day | ORAL | 0 refills | Status: DC

## 2022-09-11 MED ORDER — DULOXETINE HCL 60 MG PO CPEP: ORAL_CAPSULE | ORAL | 0 refills | Status: DC

## 2022-09-11 NOTE — Telephone Encounter (Signed)
Patient request NEW RX due to dosage change causing her to need a new prescription. Pt reports she is taking 90 mg per day.  CYMBALTA Capsules. Dosage to equal 90 mg per day  [id number] Gin[place] Otto, [place] - [address] AT [facility] OF [facility] [address] [place], [place] [address] Phone: [phone]  Fax: [phone]   Pt ph [phone]

## 2022-09-14 ENCOUNTER — Other Ambulatory Visit: Payer: Self-pay | Admitting: Internal Medicine

## 2022-09-14 DIAGNOSIS — F3342 Major depressive disorder, recurrent, in full remission: Secondary | ICD-10-CM

## 2022-09-14 DIAGNOSIS — G894 Chronic pain syndrome: Secondary | ICD-10-CM

## 2022-09-14 DIAGNOSIS — M797 Fibromyalgia: Secondary | ICD-10-CM

## 2022-10-12 ENCOUNTER — Other Ambulatory Visit: Payer: Self-pay | Admitting: Internal Medicine

## 2022-10-12 DIAGNOSIS — Z30011 Encounter for initial prescription of contraceptive pills: Secondary | ICD-10-CM

## 2022-11-08 ENCOUNTER — Encounter: Payer: Self-pay | Admitting: Internal Medicine

## 2022-11-08 ENCOUNTER — Other Ambulatory Visit: Payer: Self-pay | Admitting: Internal Medicine

## 2022-11-08 DIAGNOSIS — E038 Other specified hypothyroidism: Secondary | ICD-10-CM

## 2022-11-08 MED ORDER — THYROID 90 MG PO TABS: 90.0000 mg | ORAL_TABLET | Freq: Every day | ORAL | 0 refills | Status: DC

## 2022-11-11 ENCOUNTER — Telehealth: Payer: Self-pay | Admitting: Internal Medicine

## 2022-11-11 NOTE — Telephone Encounter (Signed)
Forms have been faxed to the Rx PA team

## 2022-11-11 NOTE — Telephone Encounter (Signed)
For our records:  We have received patient assistance forms for the pt and they have been placed in Dr. Judeen Hammans boxes.   Upon completion please fax to:  [phone]

## 2022-11-11 NOTE — Telephone Encounter (Signed)
This is Kathryn Collier patient. Sent to [facility] in error.Forwarding to Dr. Ronnald Ramp assistant.Marland KitchenJohny Chess

## 2022-11-12 ENCOUNTER — Telehealth: Payer: Self-pay

## 2022-11-12 ENCOUNTER — Other Ambulatory Visit (HOSPITAL_COMMUNITY): Payer: Self-pay

## 2022-11-12 NOTE — Telephone Encounter (Signed)
I spoke to pt about her ABBVIE PAP FOR ARMOUR THYROID 90 MG tablet.  She also said she spoke to company. She said that it is not covered by insurance and she wanted me to send her the application via mail or email because the med is very expensive. I told her I would try and get the app to her as soon as I am in the office tomorrow. I will fax the provider copy to office.

## 2022-11-12 NOTE — Telephone Encounter (Signed)
So when you fax them do you scan a copy in to chart? I see some  papers from last year that was scanned in the media or do you discard them.  This was the patient portion of the application that you was faxing right.

## 2022-11-12 NOTE — Telephone Encounter (Signed)
I Got them and as you stated they are blank I am sorry, I thought the pt filled them out and left at the office and you was sending her portion, but she did not fill them out. I tried reaching out to pt but got her VM. I Left her message asking her to give me a call. Thanks so much for your help sorry for the confusion.

## 2022-11-12 NOTE — Telephone Encounter (Signed)
Thanks! will be looking out for them. Hold on to them I will let you know if I received it.

## 2022-11-13 ENCOUNTER — Other Ambulatory Visit (HOSPITAL_COMMUNITY): Payer: Self-pay

## 2022-11-14 ENCOUNTER — Other Ambulatory Visit (HOSPITAL_COMMUNITY): Payer: Self-pay

## 2022-11-18 ENCOUNTER — Telehealth: Payer: Self-pay | Admitting: Internal Medicine

## 2022-11-18 NOTE — Telephone Encounter (Signed)
A Prior Authorization was initiated for this patients ARMOUR THYROID  through CoverMyMeds.   Key:  [id number]  SUBMITTED A PA AND IT HAS BEEN SENT TO [employer] Medicare Part D FOR REVIEW STATUS: PENDING   Selinda Orion CPhT Rx Patient Advocate

## 2022-11-18 NOTE — Telephone Encounter (Signed)
Forms received, completed and given to PCP to review and sign.

## 2022-11-18 NOTE — Telephone Encounter (Signed)
I spoke to pt and she is sending me the income and insurance information.  I am going to fax over provider portion for ABBVIE AP FOR (ARMOUR THYROID) 90 MG tablet Please have provider to review and sign and fax back to me ATTENTION Leo Fray L.    Sandre Kitty Rx Patient Advocate

## 2022-11-18 NOTE — Telephone Encounter (Signed)
For our records:   We have received patient access PW for the pt and it has been placed in Dr. Ronnald Ramp E-Folder and a copy was handed to [employer].

## 2022-11-19 ENCOUNTER — Other Ambulatory Visit (HOSPITAL_COMMUNITY): Payer: Self-pay

## 2022-11-19 NOTE — Telephone Encounter (Signed)
Received the signed ones.  Thanks Sandre Kitty Rx Patient Advocate

## 2022-11-19 NOTE — Telephone Encounter (Signed)
Forms have been signed and faxed back to Nicole. 

## 2022-11-21 NOTE — Telephone Encounter (Signed)
Submitted complete  application for ARMOUR THYROID 90MG  to [place] for patient assistance.   Phone: [phone]  Abdulla Pooley L. CPhT Rx Patient Advocate

## 2022-11-26 ENCOUNTER — Telehealth: Payer: Self-pay

## 2022-11-26 MED ORDER — THYROID 90 MG PO TABS: 90.0000 mg | ORAL_TABLET | Freq: Every day | ORAL | 0 refills | Status: DC

## 2022-11-26 NOTE — Telephone Encounter (Signed)
Submitted application for ARMOUR THYROID to [place] for patient assistance.   Phone: [place] Rx Patient Advocate

## 2022-11-26 NOTE — Telephone Encounter (Signed)
error 

## 2022-12-07 ENCOUNTER — Other Ambulatory Visit: Payer: Self-pay | Admitting: Internal Medicine

## 2022-12-07 DIAGNOSIS — F332 Major depressive disorder, recurrent severe without psychotic features: Secondary | ICD-10-CM

## 2022-12-07 DIAGNOSIS — M797 Fibromyalgia: Secondary | ICD-10-CM

## 2022-12-07 DIAGNOSIS — F3342 Major depressive disorder, recurrent, in full remission: Secondary | ICD-10-CM

## 2022-12-07 DIAGNOSIS — G894 Chronic pain syndrome: Secondary | ICD-10-CM

## 2022-12-27 NOTE — Telephone Encounter (Signed)
Patient's thyroid (ARMOUR THYROID) 90 MG tablet  was received vis patient assistance program. Patient notified that medication is up front ready for pick up.

## 2022-12-31 ENCOUNTER — Encounter: Payer: Self-pay | Admitting: Internal Medicine

## 2023-01-01 ENCOUNTER — Encounter: Payer: Self-pay | Admitting: Internal Medicine

## 2023-01-01 ENCOUNTER — Ambulatory Visit (INDEPENDENT_AMBULATORY_CARE_PROVIDER_SITE_OTHER): Payer: 59

## 2023-01-01 ENCOUNTER — Ambulatory Visit (INDEPENDENT_AMBULATORY_CARE_PROVIDER_SITE_OTHER): Payer: 59 | Admitting: Internal Medicine

## 2023-01-01 VITALS — BP 118/76 | HR 83 | Temp 98.4°F | Ht 65.0 in | Wt 134.0 lb

## 2023-01-01 DIAGNOSIS — F3342 Major depressive disorder, recurrent, in full remission: Secondary | ICD-10-CM | POA: Diagnosis not present

## 2023-01-01 DIAGNOSIS — Z30011 Encounter for initial prescription of contraceptive pills: Secondary | ICD-10-CM

## 2023-01-01 DIAGNOSIS — J45909 Unspecified asthma, uncomplicated: Secondary | ICD-10-CM

## 2023-01-01 DIAGNOSIS — R052 Subacute cough: Secondary | ICD-10-CM

## 2023-01-01 DIAGNOSIS — J4521 Mild intermittent asthma with (acute) exacerbation: Secondary | ICD-10-CM | POA: Insufficient documentation

## 2023-01-01 DIAGNOSIS — E038 Other specified hypothyroidism: Secondary | ICD-10-CM

## 2023-01-01 DIAGNOSIS — E063 Autoimmune thyroiditis: Secondary | ICD-10-CM

## 2023-01-01 DIAGNOSIS — F332 Major depressive disorder, recurrent severe without psychotic features: Secondary | ICD-10-CM | POA: Diagnosis not present

## 2023-01-01 DIAGNOSIS — J22 Unspecified acute lower respiratory infection: Secondary | ICD-10-CM

## 2023-01-01 DIAGNOSIS — G894 Chronic pain syndrome: Secondary | ICD-10-CM | POA: Diagnosis not present

## 2023-01-01 DIAGNOSIS — M797 Fibromyalgia: Secondary | ICD-10-CM

## 2023-01-01 LAB — CBC WITH DIFFERENTIAL/PLATELET
Basophils Absolute: 0 10*3/uL (ref 0.0–0.1)
Basophils Relative: 0.3 % (ref 0.0–3.0)
Eosinophils Absolute: 0.1 10*3/uL (ref 0.0–0.7)
Eosinophils Relative: 1.8 % (ref 0.0–5.0)
HCT: 39.6 % (ref 36.0–46.0)
Hemoglobin: 13.5 g/dL (ref 12.0–15.0)
Lymphocytes Relative: 35.6 % (ref 12.0–46.0)
Lymphs Abs: 2.7 10*3/uL (ref 0.7–4.0)
MCHC: 34 g/dL (ref 30.0–36.0)
MCV: 96.2 fl (ref 78.0–100.0)
Monocytes Absolute: 0.8 10*3/uL (ref 0.1–1.0)
Monocytes Relative: 10.1 % (ref 3.0–12.0)
Neutro Abs: 4 10*3/uL (ref 1.4–7.7)
Neutrophils Relative %: 52.2 % (ref 43.0–77.0)
Platelets: 302 10*3/uL (ref 150.0–400.0)
RBC: 4.12 Mil/uL (ref 3.87–5.11)
RDW: 13.4 % (ref 11.5–15.5)
WBC: 7.7 10*3/uL (ref 4.0–10.5)

## 2023-01-01 LAB — BASIC METABOLIC PANEL
BUN: 14 mg/dL (ref 6–23)
CO2: 31 mEq/L (ref 19–32)
Calcium: 10 mg/dL (ref 8.4–10.5)
Chloride: 98 mEq/L (ref 96–112)
Creatinine, Ser: 0.73 mg/dL (ref 0.40–1.20)
GFR: 96.74 mL/min (ref >60.00)
Glucose, Bld: 81 mg/dL (ref 70–99)
Potassium: 4.7 mEq/L (ref 3.5–5.1)
Sodium: 135 mEq/L (ref 135–145)

## 2023-01-01 LAB — TSH: TSH: 2.2 u[IU]/mL (ref 0.35–5.50)

## 2023-01-01 MED ORDER — FLUTICASONE PROPIONATE HFA 110 MCG/ACT IN AERO: 1.0000 | INHALATION_SPRAY | Freq: Two times a day (BID) | RESPIRATORY_TRACT | 1 refills | Status: DC

## 2023-01-01 MED ORDER — DULOXETINE HCL 30 MG PO CPEP: ORAL_CAPSULE | ORAL | 1 refills | Status: DC

## 2023-01-01 MED ORDER — NORETHINDRONE 0.35 MG PO TABS: 1.0000 | ORAL_TABLET | Freq: Every day | ORAL | 1 refills | Status: DC

## 2023-01-01 MED ORDER — AIRSUPRA 90-80 MCG/ACT IN AERO: 2.0000 | INHALATION_SPRAY | Freq: Four times a day (QID) | RESPIRATORY_TRACT | 3 refills | Status: DC | PRN

## 2023-01-01 MED ORDER — CEFDINIR 300 MG PO CAPS: 300.0000 mg | ORAL_CAPSULE | Freq: Two times a day (BID) | ORAL | 0 refills | Status: AC

## 2023-01-01 MED ORDER — DULOXETINE HCL 60 MG PO CPEP: 60.0000 mg | ORAL_CAPSULE | Freq: Every day | ORAL | 1 refills | Status: DC

## 2023-01-01 MED ORDER — HYDROCODONE BIT-HOMATROP MBR 5-1.5 MG/5ML PO SOLN: 5.0000 mL | Freq: Three times a day (TID) | ORAL | 0 refills | Status: AC | PRN

## 2023-01-01 MED ORDER — FLUCONAZOLE 150 MG PO TABS: 150.0000 mg | ORAL_TABLET | Freq: Every day | ORAL | 3 refills | Status: DC

## 2023-01-01 NOTE — Patient Instructions (Signed)
Asthma, Adult ? ?Asthma is a long-term (chronic) condition that causes recurrent episodes in which the lower airways in the lungs become tight and narrow. The narrowing is caused by inflammation and tightening of the smooth muscle around the lower airways. ?Asthma episodes, also called asthma attacks or asthma flares, may cause coughing, making high-pitched whistling sounds when you breathe, most often when you breathe out (wheezing), shortness of breath, and chest pain. The airways may produce extra mucus caused by the inflammation and irritation. During an attack, it can be difficult to breathe. Asthma attacks can range from minor to life-threatening. ?Asthma cannot be cured, but medicines and lifestyle changes can help control it and treat acute attacks. It is important to keep your asthma well controlled so the condition does not interfere with your daily life. ?What are the causes? ?This condition is believed to be caused by inherited (genetic) and environmental factors, but its exact cause is not known. ?What can trigger an asthma attack? ?Many things can bring on an asthma attack or make symptoms worse. These triggers are different for every person. Common triggers include: ?Allergens and irritants like mold, dust, pet dander, cockroaches, pollen, air pollution, and chemical odors. ?Cigarette smoke. ?Weather changes and cold air. ?Stress and strong emotional responses such as crying or laughing hard. ?Certain medications such as aspirin or beta blockers. ?Infections and inflammatory conditions, such as the flu, a cold, pneumonia, or inflammation of the nasal membranes (rhinitis). ?Gastroesophageal reflux disease (GERD). ?What are the signs or symptoms? ?Symptoms may occur right after exposure to an asthma trigger or hours later and can vary by person. Common signs and symptoms include: ?Wheezing. ?Trouble breathing (shortness of breath). ?Excessive nighttime or early morning coughing. ?Chest  tightness. ?Tiredness (fatigue) with minimal activity. ?Difficulty talking in complete sentences. ?Poor exercise tolerance. ?How is this diagnosed? ?This condition is diagnosed based on: ?A physical exam and your medical history. ?Tests, which may include: ?Lung function studies to evaluate the flow of air in your lungs. ?Allergy tests. ?Imaging tests, such as X-rays. ?How is this treated? ?There is no cure, but symptoms can be controlled with proper treatment. Treatment usually involves: ?Identifying and avoiding your asthma triggers. ?Inhaled medicines. Two types are commonly used to treat asthma, depending on severity: ?Controller medicines. These help prevent asthma symptoms from occurring. They are taken every day. ?Fast-acting reliever or rescue medicines. These quickly relieve asthma symptoms. They are used as needed and provide short-term relief. ?Using other medicines, such as: ?Allergy medicines, such as antihistamines, if your asthma attacks are triggered by allergens. ?Immune medicines (immunomodulators). These are medicines that help control the immune system. ?Using supplemental oxygen. This is only needed during a severe episode. ?Creating an asthma action plan. An asthma action plan is a written plan for managing and treating your asthma attacks. This plan includes: ?A list of your asthma triggers and how to avoid them. ?Information about when medicines should be taken and when their dosage should be changed. ?Instructions about using a device called a peak flow meter. A peak flow meter measures how well the lungs are working and the severity of your asthma. It helps you monitor your condition. ?Follow these instructions at home: ?Take over-the-counter and prescription medicines only as told by your health care provider. ?Stay up to date on all vaccinations as recommended by your healthcare provider, including vaccines for the flu and pneumonia. ?Use a peak flow meter and keep track of your peak flow  readings. ?Understand and use your asthma   action plan to address any asthma flares. ?Do not smoke or allow anyone to smoke in your home. ?Contact a health care provider if: ?You have wheezing, shortness of breath, or a cough that is not responding to medicines. ?Your medicines are causing side effects, such as a rash, itching, swelling, or trouble breathing. ?You need to use a reliever medicine more than 2-3 times a week. ?Your peak flow reading is still at 50-79% of your personal best after following your action plan for 1 hour. ?You have a fever and shortness of breath. ?Get help right away if: ?You are getting worse and do not respond to treatment during an asthma attack. ?You are short of breath when at rest or when doing very little physical activity. ?You have difficulty eating, drinking, or talking. ?You have chest pain or tightness. ?You develop a fast heartbeat or palpitations. ?You have a bluish color to your lips or fingernails. ?You are light-headed or dizzy, or you faint. ?Your peak flow reading is less than 50% of your personal best. ?You feel too tired to breathe normally. ?These symptoms may be an emergency. Get help right away. Call 911. ?Do not wait to see if the symptoms will go away. ?Do not drive yourself to the hospital. ?Summary ?Asthma is a long-term (chronic) condition that causes recurrent episodes in which the airways become tight and narrow. Asthma episodes, also called asthma attacks or asthma flares, can cause coughing, wheezing, shortness of breath, and chest pain. ?Asthma cannot be cured, but medicines and lifestyle changes can help keep it well controlled and prevent asthma flares. ?Make sure you understand how to avoid triggers and how and when to use your medicines. ?Asthma attacks can range from minor to life-threatening. Get help right away if you have an asthma attack and do not respond to treatment with your usual rescue medicines. ?This information is not intended to replace  advice given to you by your health care provider. Make sure you discuss any questions you have with your health care provider. ?Document Revised: 08/08/2021 Document Reviewed: 07/30/2021 ?Elsevier Patient Education ? 2023 [employer] ? ?

## 2023-01-01 NOTE — Progress Notes (Signed)
Subjective:  Patient ID: Kathryn Collier, female    DOB: 04-21-1971  Age: 52 y.o. MRN: [id number]  CC: Cough, Asthma, and Hypothyroidism   HPI Kathryn Collier presents for f/up -  She complains of a 12-day history of cough that is wet but she is not bringing up any phlegm.  She has also had shortness of breath, wheezing, and night sweats.  She denies fever, chills, hemoptysis, or diaphoresis.  Outpatient Medications Prior to Visit  Medication Sig Dispense Refill   b complex vitamins capsule Take by mouth.     Bromelains 500 MG TABS Take by mouth 3 (three) times daily.     buPROPion (WELLBUTRIN SR) 150 MG 12 hr tablet TAKE 1 TABLET(150 MG) BY MOUTH TWICE DAILY 180 tablet 3   Cholecalciferol 25 MCG (1000 UT) tablet Take by mouth.     cyclobenzaprine (FLEXERIL) 10 MG tablet Take 10 mg by mouth 3 (three) times daily as needed for muscle spasms.     diclofenac (VOLTAREN) 75 MG EC tablet Take 75 mg by mouth 2 (two) times daily.     hydroxychloroquine (PLAQUENIL) 200 MG tablet Take by mouth 2 (two) times daily.     montelukast (SINGULAIR) 10 MG tablet Take 1 tablet (10 mg total) by mouth at bedtime. 90 tablet 3   NON FORMULARY      NON FORMULARY      NON FORMULARY 500 mg in the morning and at bedtime.     NON FORMULARY daily.     Omega-3 1000 MG CAPS Take by mouth.     OVER THE COUNTER MEDICATION      pyridostigmine (MESTINON) 60 MG tablet Take 0.5-1 tablets (30-60 mg total) by mouth 3 (three) times daily. 90 tablet 12   thyroid (ARMOUR THYROID) 90 MG tablet Take 1 tablet (90 mg total) by mouth daily. TAKE 1 TABLET BY MOUTH EVERY DAY BEFORE BREAKFAST 30 tablet 0   DULoxetine (CYMBALTA) 30 MG capsule TAKE 1 CAPSULE(30 MG) BY MOUTH DAILY 90 capsule 0   DULoxetine (CYMBALTA) 60 MG capsule TAKE 1 CAPSULE(60 MG) BY MOUTH DAILY 90 capsule 0   fluticasone (FLOVENT HFA) 110 MCG/ACT inhaler Inhale 1 puff into the lungs 2 (two) times daily. 3 each 1   norethindrone (MICRONOR) 0.35 MG tablet TAKE 1  TABLET(0.35 MG) BY MOUTH DAILY 84 tablet 0   No facility-administered medications prior to visit.    ROS Review of Systems  Constitutional: Negative.  Negative for chills, diaphoresis, fatigue, fever and unexpected weight change.  HENT: Negative.  Negative for sore throat and trouble swallowing.   Respiratory:  Positive for cough, shortness of breath and wheezing. Negative for choking and chest tightness.   Cardiovascular:  Negative for chest pain, palpitations and leg swelling.  Gastrointestinal:  Negative for abdominal pain, constipation, diarrhea, nausea and vomiting.  Genitourinary: Negative.  Negative for difficulty urinating.  Musculoskeletal:  Positive for arthralgias and myalgias. Negative for joint swelling and neck pain.  Skin: Negative.   Neurological: Negative.  Negative for dizziness and weakness.  Hematological:  Negative for adenopathy. Does not bruise/bleed easily.  Psychiatric/Behavioral: Negative.      Objective:  BP 118/76 (BP Location: Left Arm, Patient Position: Sitting, Cuff Size: Large)   Pulse 83   Temp 98.4 F (36.9 C) (Oral)   Ht '5\' 5"'$  (1.651 m)   Wt 134 lb (60.8 kg)   SpO2 95%   BMI 22.30 kg/m   BP Readings from Last 3 Encounters:  01/01/23 118/76  07/23/22 118/72  05/08/22 106/62    Wt Readings from Last 3 Encounters:  01/01/23 134 lb (60.8 kg)  07/23/22 128 lb (58.1 kg)  05/08/22 131 lb (59.4 kg)    Physical Exam Vitals reviewed.  HENT:     Mouth/Throat:     Pharynx: Oropharynx is clear.  Eyes:     General: No scleral icterus.    Conjunctiva/sclera: Conjunctivae normal.  Cardiovascular:     Rate and Rhythm: Normal rate and regular rhythm.     Heart sounds: No murmur heard. Pulmonary:     Effort: Pulmonary effort is normal.     Breath sounds: No stridor. No wheezing, rhonchi or rales.  Abdominal:     General: Abdomen is flat.     Palpations: There is no mass.     Tenderness: There is no abdominal tenderness. There is no guarding.      Hernia: No hernia is present.  Musculoskeletal:        General: Normal range of motion.     Cervical back: Neck supple.     Right lower leg: No edema.     Left lower leg: No edema.  Lymphadenopathy:     Cervical: No cervical adenopathy.  Skin:    General: Skin is warm and dry.     Coloration: Skin is not pale.  Neurological:     General: No focal deficit present.     Mental Status: She is alert. Mental status is at baseline.  Psychiatric:        Mood and Affect: Mood normal.        Behavior: Behavior normal.     Lab Results  Component Value Date   WBC 7.2 05/08/2022   HGB 14.0 05/08/2022   HCT 41.5 05/08/2022   PLT 238.0 05/08/2022   GLUCOSE 105 (H) 05/08/2022   ALT 19 05/08/2022   AST 24 05/08/2022   NA 139 05/08/2022   K 4.1 05/08/2022   CL 100 05/08/2022   CREATININE 0.76 05/08/2022   BUN 10 05/08/2022   CO2 31 05/08/2022   TSH 0.87 05/08/2022    MR Shoulder Left w/o contrast  Result Date: 10/24/2020 CLINICAL DATA:  Left shoulder pain with cracking and crunching for 10 years. Left arm weakness. No acute injury or prior relevant surgery. EXAM: MRI OF THE LEFT SHOULDER WITHOUT CONTRAST TECHNIQUE: Multiplanar, multisequence MR imaging of the shoulder was performed. No intravenous contrast was administered. COMPARISON:  Limited correlation made with a chest CT 06/24/2017. FINDINGS: Rotator cuff: Mild supraspinatus and infraspinatus tendinosis without tear. The subscapularis and teres minor tendons appear normal. Muscles:  No focal muscular atrophy or edema. Biceps long head:  Intact and normally positioned. Acromioclavicular Joint: The acromion is type 2. There are mild acromioclavicular degenerative changes. No significant fluid is present in the subacromial - subdeltoid bursa. Glenohumeral Joint: No significant shoulder joint effusion or glenohumeral arthropathy. Labrum: Labral assessment is limited by the lack of joint fluid. No evidence of labral tear or paralabral  cyst. Bones: No acute or significant extra-articular osseous findings.Minimal subcortical cyst formation in the humeral head near the infraspinatus insertion. Other: Multiple prominent lymph nodes are noted within the left axilla and subpectoral space, similar to previous chest CT. On previous CT, these were present bilaterally. IMPRESSION: 1. Mild supraspinatus and infraspinatus tendinosis. No evidence of rotator cuff tear. 2. The labrum and biceps tendon appear intact. 3. Mild acromioclavicular degenerative changes. 4. Multiple prominent lymph nodes within the left axilla and subpectoral space, similar to  previous chest CT and presumably benign/reactive. Electronically Signed   By: Richardean Sale M.D.   On: 10/24/2020 09:45   DG Chest 2 View  Result Date: 01/01/2023 CLINICAL DATA:  Cough over the last 2 weeks. Chest congestion. Wheezing. EXAM: CHEST - 2 VIEW COMPARISON:  06/24/2017 FINDINGS: Heart and mediastinal shadows are normal. There is mild bronchial thickening but no infiltrate, collapse or effusion. Abnormal bone finding. IMPRESSION: Possible bronchitis. No consolidation or collapse. Electronically Signed   By: Nelson Chimes M.D.   On: 01/01/2023 10:36      Assessment & Plan:   Kathryn Collier was seen today for cough, asthma and hypothyroidism.  Diagnoses and all orders for this visit:  Hypothyroidism due to Hashimoto's thyroiditis- She is euthyroid. -     TSH; Future -     CBC with Differential/Platelet; Future -     Basic metabolic panel; Future -     Basic metabolic panel -     CBC with Differential/Platelet -     TSH  Chronic pain disorder -     DULoxetine (CYMBALTA) 30 MG capsule; TAKE 1 CAPSULE(30 MG) BY MOUTH DAILY -     DULoxetine (CYMBALTA) 60 MG capsule; Take 1 capsule (60 mg total) by mouth daily.  Fibromyalgia -     DULoxetine (CYMBALTA) 30 MG capsule; TAKE 1 CAPSULE(30 MG) BY MOUTH DAILY -     DULoxetine (CYMBALTA) 60 MG capsule; Take 1 capsule (60 mg total) by mouth  daily.  Recurrent major depressive disorder, in full remission (HCC) -     DULoxetine (CYMBALTA) 30 MG capsule; TAKE 1 CAPSULE(30 MG) BY MOUTH DAILY -     DULoxetine (CYMBALTA) 60 MG capsule; Take 1 capsule (60 mg total) by mouth daily. -     CBC with Differential/Platelet; Future -     Basic metabolic panel; Future -     Basic metabolic panel -     CBC with Differential/Platelet  Severe episode of recurrent major depressive disorder, without psychotic features (HCC) -     DULoxetine (CYMBALTA) 30 MG capsule; TAKE 1 CAPSULE(30 MG) BY MOUTH DAILY -     CBC with Differential/Platelet; Future -     Basic metabolic panel; Future -     Basic metabolic panel -     CBC with Differential/Platelet  Encounter for initial prescription of contraceptive pills -     norethindrone (MICRONOR) 0.35 MG tablet; Take 1 tablet (0.35 mg total) by mouth daily.  Uncomplicated asthma, unspecified asthma severity, unspecified whether persistent -     fluticasone (FLOVENT HFA) 110 MCG/ACT inhaler; Inhale 1 puff into the lungs 2 (two) times daily. -     Albuterol-Budesonide (AIRSUPRA) 90-80 MCG/ACT AERO; Inhale 2 puffs into the lungs 4 (four) times daily as needed.  Subacute cough -     DG Chest 2 View; Future -     HYDROcodone bit-homatropine (HYCODAN) 5-1.5 MG/5ML syrup; Take 5 mLs by mouth every 8 (eight) hours as needed for up to 7 days for cough.  Mild intermittent asthma with exacerbation -     Albuterol-Budesonide (AIRSUPRA) 90-80 MCG/ACT AERO; Inhale 2 puffs into the lungs 4 (four) times daily as needed.  LRTI (lower respiratory tract infection) -     cefdinir (OMNICEF) 300 MG capsule; Take 1 capsule (300 mg total) by mouth 2 (two) times daily for 7 days. -     fluconazole (DIFLUCAN) 150 MG tablet; Take 1 tablet (150 mg total) by mouth daily. -  HYDROcodone bit-homatropine (HYCODAN) 5-1.5 MG/5ML syrup; Take 5 mLs by mouth every 8 (eight) hours as needed for up to 7 days for cough.   I have  changed Kathryn Collier's DULoxetine and norethindrone. I am also having her start on Airsupra, cefdinir, fluconazole, and HYDROcodone bit-homatropine. Additionally, I am having her maintain her hydroxychloroquine, diclofenac, Cholecalciferol, b complex vitamins, Bromelains, Omega-3, NON FORMULARY, NON FORMULARY, OVER THE COUNTER MEDICATION, NON FORMULARY, cyclobenzaprine, NON FORMULARY, pyridostigmine, montelukast, buPROPion, thyroid, DULoxetine, and fluticasone.  Meds ordered this encounter  Medications   DULoxetine (CYMBALTA) 30 MG capsule    Sig: TAKE 1 CAPSULE(30 MG) BY MOUTH DAILY    Dispense:  90 capsule    Refill:  1   DULoxetine (CYMBALTA) 60 MG capsule    Sig: Take 1 capsule (60 mg total) by mouth daily.    Dispense:  90 capsule    Refill:  1   norethindrone (MICRONOR) 0.35 MG tablet    Sig: Take 1 tablet (0.35 mg total) by mouth daily.    Dispense:  84 tablet    Refill:  1   fluticasone (FLOVENT HFA) 110 MCG/ACT inhaler    Sig: Inhale 1 puff into the lungs 2 (two) times daily.    Dispense:  3 each    Refill:  1   Albuterol-Budesonide (AIRSUPRA) 90-80 MCG/ACT AERO    Sig: Inhale 2 puffs into the lungs 4 (four) times daily as needed.    Dispense:  10.7 g    Refill:  3   cefdinir (OMNICEF) 300 MG capsule    Sig: Take 1 capsule (300 mg total) by mouth 2 (two) times daily for 7 days.    Dispense:  14 capsule    Refill:  0   fluconazole (DIFLUCAN) 150 MG tablet    Sig: Take 1 tablet (150 mg total) by mouth daily.    Dispense:  1 tablet    Refill:  3   HYDROcodone bit-homatropine (HYCODAN) 5-1.5 MG/5ML syrup    Sig: Take 5 mLs by mouth every 8 (eight) hours as needed for up to 7 days for cough.    Dispense:  120 mL    Refill:  0     Follow-up: Return in about 6 months (around 07/02/2023).  Scarlette Calico, MD

## 2023-01-02 ENCOUNTER — Other Ambulatory Visit: Payer: Self-pay | Admitting: Internal Medicine

## 2023-01-02 ENCOUNTER — Encounter: Payer: Self-pay | Admitting: Internal Medicine

## 2023-01-02 ENCOUNTER — Telehealth: Payer: Self-pay

## 2023-01-02 NOTE — Telephone Encounter (Signed)
Key: Kathryn Collier

## 2023-01-02 NOTE — Telephone Encounter (Signed)
PA approved through 11/04/2023

## 2023-01-15 LAB — HM MAMMOGRAPHY: HM Mammogram: NORMAL (ref 0–4)

## 2023-01-15 LAB — HM PAP SMEAR

## 2023-01-17 ENCOUNTER — Encounter: Payer: Self-pay | Admitting: Internal Medicine

## 2023-01-17 ENCOUNTER — Other Ambulatory Visit: Payer: Self-pay | Admitting: Internal Medicine

## 2023-01-17 DIAGNOSIS — M797 Fibromyalgia: Secondary | ICD-10-CM

## 2023-01-17 DIAGNOSIS — F332 Major depressive disorder, recurrent severe without psychotic features: Secondary | ICD-10-CM

## 2023-01-17 DIAGNOSIS — F3342 Major depressive disorder, recurrent, in full remission: Secondary | ICD-10-CM

## 2023-01-17 DIAGNOSIS — G894 Chronic pain syndrome: Secondary | ICD-10-CM

## 2023-01-17 MED ORDER — DULOXETINE HCL 30 MG PO CPEP: 90.0000 mg | ORAL_CAPSULE | Freq: Every day | ORAL | 0 refills | Status: DC

## 2023-01-23 ENCOUNTER — Other Ambulatory Visit: Payer: Self-pay | Admitting: Internal Medicine

## 2023-01-23 DIAGNOSIS — M797 Fibromyalgia: Secondary | ICD-10-CM

## 2023-01-23 DIAGNOSIS — F332 Major depressive disorder, recurrent severe without psychotic features: Secondary | ICD-10-CM

## 2023-01-23 DIAGNOSIS — G894 Chronic pain syndrome: Secondary | ICD-10-CM

## 2023-01-23 DIAGNOSIS — F3342 Major depressive disorder, recurrent, in full remission: Secondary | ICD-10-CM

## 2023-02-10 ENCOUNTER — Other Ambulatory Visit: Payer: Self-pay | Admitting: Internal Medicine

## 2023-02-10 DIAGNOSIS — J4521 Mild intermittent asthma with (acute) exacerbation: Secondary | ICD-10-CM

## 2023-02-10 DIAGNOSIS — J45909 Unspecified asthma, uncomplicated: Secondary | ICD-10-CM

## 2023-02-10 MED ORDER — AIRSUPRA 90-80 MCG/ACT IN AERO: 2.0000 | INHALATION_SPRAY | Freq: Four times a day (QID) | RESPIRATORY_TRACT | 3 refills | Status: DC | PRN

## 2023-02-17 ENCOUNTER — Other Ambulatory Visit: Payer: Self-pay | Admitting: Internal Medicine

## 2023-02-17 ENCOUNTER — Encounter: Payer: Self-pay | Admitting: Internal Medicine

## 2023-02-17 DIAGNOSIS — J45909 Unspecified asthma, uncomplicated: Secondary | ICD-10-CM

## 2023-02-17 MED ORDER — QVAR REDIHALER 40 MCG/ACT IN AERB: 1.0000 | INHALATION_SPRAY | Freq: Two times a day (BID) | RESPIRATORY_TRACT | 1 refills | Status: DC

## 2023-04-11 ENCOUNTER — Encounter: Payer: Self-pay | Admitting: Internal Medicine

## 2023-04-18 ENCOUNTER — Encounter: Payer: Self-pay | Admitting: Internal Medicine

## 2023-04-18 MED ORDER — MONTELUKAST SODIUM 10 MG PO TABS: 10.0000 mg | ORAL_TABLET | Freq: Every day | ORAL | 0 refills | Status: DC

## 2023-06-19 ENCOUNTER — Encounter (INDEPENDENT_AMBULATORY_CARE_PROVIDER_SITE_OTHER): Payer: Self-pay

## 2023-06-19 ENCOUNTER — Other Ambulatory Visit: Payer: Self-pay | Admitting: Internal Medicine

## 2023-06-19 DIAGNOSIS — F3342 Major depressive disorder, recurrent, in full remission: Secondary | ICD-10-CM

## 2023-06-19 DIAGNOSIS — G894 Chronic pain syndrome: Secondary | ICD-10-CM

## 2023-06-19 DIAGNOSIS — M797 Fibromyalgia: Secondary | ICD-10-CM

## 2023-06-19 DIAGNOSIS — F332 Major depressive disorder, recurrent severe without psychotic features: Secondary | ICD-10-CM

## 2023-07-01 ENCOUNTER — Ambulatory Visit: Payer: 59

## 2023-07-02 ENCOUNTER — Encounter: Payer: 59 | Admitting: Internal Medicine

## 2023-07-03 ENCOUNTER — Ambulatory Visit (INDEPENDENT_AMBULATORY_CARE_PROVIDER_SITE_OTHER): Payer: 59

## 2023-07-03 VITALS — BP 110/70 | Ht 65.75 in | Wt 135.2 lb

## 2023-07-03 DIAGNOSIS — Z0000 Encounter for general adult medical examination without abnormal findings: Secondary | ICD-10-CM | POA: Diagnosis not present

## 2023-07-03 NOTE — Progress Notes (Signed)
Subjective:   Kathryn Collier is a 52 y.o. female who presents for an Initial Medicare Annual Wellness Visit.  Visit Complete: In person  Patient Medicare AWV questionnaire was completed by the patient on 07/03/2023; I have confirmed that all information answered by patient is correct and no changes since this date.  Review of Systems   Cardiac Risk Factors include: Other (see comment), Risk factor comments: Systemic lupus erythematosus, Myasthenia gravis, Hashimoto's Disease     Objective:    Today's Vitals   07/03/23 1302  Weight: 135 lb 3.2 oz (61.3 kg)  Height: 5' 5.75" (1.67 m)   Body mass index is 21.99 kg/m.     07/03/2023    1:17 PM 07/07/2020    9:18 AM 11/30/2019   10:18 AM 11/29/2014   11:10 AM  Advanced Directives  Does Patient Have a Medical Advance Directive? No No No No  Would patient like information on creating a medical advance directive? Yes (MAU/Ambulatory/Procedural Areas - Information given) No - Patient declined No - Patient declined Yes - Educational materials given    Current Medications (verified) Outpatient Encounter Medications as of 07/03/2023  Medication Sig   Albuterol-Budesonide (AIRSUPRA) 90-80 MCG/ACT AERO Inhale 2 puffs into the lungs 4 (four) times daily as needed.   b complex vitamins capsule Take by mouth.   beclomethasone (QVAR REDIHALER) 40 MCG/ACT inhaler Inhale 1 puff into the lungs 2 (two) times daily.   Bromelains 500 MG TABS Take by mouth 3 (three) times daily.   buPROPion (WELLBUTRIN SR) 150 MG 12 hr tablet TAKE 1 TABLET(150 MG) BY MOUTH TWICE DAILY   Cholecalciferol 25 MCG (1000 UT) tablet Take by mouth.   cyclobenzaprine (FLEXERIL) 10 MG tablet Take 10 mg by mouth 3 (three) times daily as needed for muscle spasms.   diclofenac (VOLTAREN) 75 MG EC tablet Take 75 mg by mouth 2 (two) times daily.   DULoxetine (CYMBALTA) 30 MG capsule TAKE 3 CAPSULE BY MOUTH DAILY   hydroxychloroquine (PLAQUENIL) 200 MG tablet Take by mouth 2 (two)  times daily.   montelukast (SINGULAIR) 10 MG tablet Take 1 tablet (10 mg total) by mouth at bedtime.   NON FORMULARY    NON FORMULARY    NON FORMULARY 500 mg in the morning and at bedtime.   NON FORMULARY daily.   Omega-3 1000 MG CAPS Take by mouth.   OVER THE COUNTER MEDICATION    thyroid (ARMOUR THYROID) 90 MG tablet Take 1 tablet (90 mg total) by mouth daily. TAKE 1 TABLET BY MOUTH EVERY DAY BEFORE BREAKFAST   fluconazole (DIFLUCAN) 150 MG tablet Take 1 tablet (150 mg total) by mouth daily.   norethindrone (MICRONOR) 0.35 MG tablet Take 1 tablet (0.35 mg total) by mouth daily.   pyridostigmine (MESTINON) 60 MG tablet Take 0.5-1 tablets (30-60 mg total) by mouth 3 (three) times daily.   No facility-administered encounter medications on file as of 07/03/2023.    Allergies (verified) Corticosteroids, Other, Prednisone, Sertraline, and Sulfa antibiotics   History: Past Medical History:  Diagnosis Date   Connective tissue disease (HCC)    Connective tissue disease (HCC)    undifferentiated   Hashimoto's disease    Ocular myasthenia (HCC)    Systemic lupus (HCC)    Past Surgical History:  Procedure Laterality Date   WISDOM TOOTH EXTRACTION     Family History  Problem Relation Age of Onset   Brain cancer Mother    Cancer - Lung Father    Social History  Socioeconomic History   Marital status: Single    Spouse name: Not on file   Number of children: 0   Years of education: 93   Highest education level: Not on file  Occupational History   Not on file  Tobacco Use   Smoking status: Never    Passive exposure: Never   Smokeless tobacco: Never  Vaping Use   Vaping status: Never Used  Substance and Sexual Activity   Alcohol use: Yes    Alcohol/week: 20.0 standard drinks of alcohol    Types: 20 Glasses of wine per week    Comment: 11/27/20 1-3 drinks/ 2-4 nights weekly   Drug use: Yes    Types: Marijuana   Sexual activity: Not Currently    Partners: Male  Other  Topics Concern   Not on file  Social History Narrative   Lives alone   Caffeine- coffee, 1 cup daily   Social Determinants of Health   Financial Resource Strain: Not on file  Food Insecurity: Food Insecurity Present (07/03/2023)   Hunger Vital Sign    Worried About Running Out of Food in the Last Year: Sometimes true    Ran Out of Food in the Last Year: Never true  Transportation Needs: No Transportation Needs (07/03/2023)   PRAPARE - Administrator, Civil Service (Medical): No    Lack of Transportation (Non-Medical): No  Physical Activity: Insufficiently Active (07/03/2023)   Exercise Vital Sign    Days of Exercise per Week: 4 days    Minutes of Exercise per Session: 30 min  Stress: No Stress Concern Present (07/03/2023)   [employer] of Occupational Health - Occupational Stress Questionnaire    Feeling of Stress : Only a little  Social Connections: Unknown (07/03/2023)   Social Connection and Isolation Panel [NHANES]    Frequency of Communication with Friends and Family: More than three times a week    Frequency of Social Gatherings with Friends and Family: Three times a week    Attends Religious Services: Not on file    Active Member of Clubs or Organizations: Yes    Attends Banker Meetings: More than 4 times per year    Marital Status: Never married    Tobacco Counseling Counseling given: Not Answered   Clinical Intake:  Pre-visit preparation completed: Yes  Pain : No/denies pain     BMI - recorded: 21.99 Nutritional Risks: None Diabetes: No  How often do you need to have someone help you when you read instructions, pamphlets, or other written materials from your doctor or pharmacy?: 2 - Rarely  Interpreter Needed?: No  Information entered by :: Soul Hackman, RMA   Activities of Daily Living    07/03/2023   11:05 AM  In your present state of health, do you have any difficulty performing the following activities:  Hearing? 0   Vision? 0  Difficulty concentrating or making decisions? 1  Walking or climbing stairs? 0  Dressing or bathing? 0  Doing errands, shopping? 0  Preparing Food and eating ? Y  Using the Toilet? N  In the past six months, have you accidently leaked urine? N  Do you have problems with loss of bowel control? N  Managing your Medications? N  Managing your Finances? N  Housekeeping or managing your Housekeeping? N    Patient Care Team: Etta Grandchild, MD as PCP - General (Internal Medicine)  Indicate any recent Medical Services you may have received from other than [facility] providers  in the past year (date may be approximate).     Assessment:   This is a routine wellness examination for [place].  Hearing/Vision screen Hearing Screening - Comments:: Denies hearing difficulties   Vision Screening - Comments:: Wears eyeglasses  Dietary issues and exercise activities discussed:     Goals Addressed               This Visit's Progress     Patient Stated (pt-stated)        More vigorous exercises.      Depression Screen    07/03/2023    1:19 PM 07/23/2022    4:11 PM  PHQ 2/9 Scores  PHQ - 2 Score 0 0  PHQ- 9 Score 0 2    Fall Risk    07/03/2023   11:05 AM 07/23/2022    4:12 PM 06/10/2017   11:06 AM  Fall Risk   Falls in the past year? 0 0 No  Number falls in past yr: 0 0   Injury with Fall? 0 0   Risk for fall due to :  No Fall Risks   Follow up Falls evaluation completed;Falls prevention discussed Falls evaluation completed     MEDICARE RISK AT HOME: Medicare Risk at Home Any stairs in or around the home?: Yes If so, are there any without handrails?: No Home free of loose throw rugs in walkways, pet beds, electrical cords, etc?: Yes Adequate lighting in your home to reduce risk of falls?: Yes Life alert?: No Use of a cane, walker or w/c?: No Grab bars in the bathroom?: No Shower chair or bench in shower?: No Elevated toilet seat or a handicapped toilet?:  No  TIMED UP AND GO:  Was the test performed? Yes  Length of time to ambulate 10 feet: 10 sec Gait steady and fast without use of assistive device    Cognitive Function:        Immunizations Immunization History  Administered Date(s) Administered   Influenza Inj Mdck Quad Pf 09/11/2017   Influenza Split 08/03/2013, 10/14/2014, 09/11/2017   Influenza,inj,Quad PF,6+ Mos 08/05/2018   Influenza,inj,quad, With Preservative 08/04/2016, 09/11/2017   Influenza-Unspecified 08/05/2018   Moderna Sars-Covid-2 Vaccination 01/10/2020, 02/07/2020, 07/13/2020   PNEUMOCOCCAL CONJUGATE-20 05/17/2022   Pfizer Covid-19 Vaccine Bivalent Booster 33yrs & up 05/11/2021, 02/08/2022   Pneumococcal Polysaccharide-23 08/03/2013   Tdap 06/04/2010, 05/17/2022    TDAP status: Up to date  Flu Vaccine status: Due, Education has been provided regarding the importance of this vaccine. Advised may receive this vaccine at local pharmacy or Health Dept. Aware to provide a copy of the vaccination record if obtained from local pharmacy or Health Dept. Verbalized acceptance and understanding.  Pneumococcal vaccine status: Up to date  Covid-19 vaccine status: Information provided on how to obtain vaccines.   Qualifies for Shingles Vaccine? No   Zostavax completed No   Shingrix Completed?: No.    Education has been provided regarding the importance of this vaccine. Patient has been advised to call insurance company to determine out of pocket expense if they have not yet received this vaccine. Advised may also receive vaccine at local pharmacy or Health Dept. Verbalized acceptance and understanding.  Screening Tests Health Maintenance  Topic Date Due   HIV Screening  Never done   Hepatitis C Screening  Never done   PAP SMEAR-Modifier  Never done   COVID-19 Vaccine (6 - 2023-24 season) 07/05/2022   Medicare Annual Wellness (AWV)  07/02/2024   Fecal DNA (Cologuard)  05/15/2025  DTaP/Tdap/Td (3 - Td or Tdap)  05/17/2032   HPV VACCINES  Aged Out   INFLUENZA VACCINE  Discontinued    Health Maintenance  Health Maintenance Due  Topic Date Due   HIV Screening  Never done   Hepatitis C Screening  Never done   PAP SMEAR-Modifier  Never done   COVID-19 Vaccine (6 - 2023-24 season) 07/05/2022    Colorectal cancer screening: Type of screening: Cologuard. Completed 05/15/2022. Repeat every 3 years  Mammogram status: Completed 01/2022. Repeat every year   Lung Cancer Screening: (Low Dose CT Chest recommended if Age 31-80 years, 20 pack-year currently smoking OR have quit w/in 15years.) does not qualify.   Lung Cancer Screening Referral: N/A  Additional Screening:  Hepatitis C Screening: does not qualify;   Vision Screening: Recommended annual ophthalmology exams for early detection of glaucoma and other disorders of the eye. Is the patient up to date with their annual eye exam?  Yes  Who is the provider or what is the name of the office in which the patient attends annual eye exams? [facility] If pt is not established with a provider, would they like to be referred to a provider to establish care? No .   Dental Screening: Recommended annual dental exams for proper oral hygiene   Community Resource Referral / Chronic Care Management: CRR required this visit?  No   CCM required this visit?  No     Plan:     I have personally reviewed and noted the following in the patient's chart:   Medical and social history Use of alcohol, tobacco or illicit drugs  Current medications and supplements including opioid prescriptions. Patient is currently taking opioid prescriptions. Information provided to patient regarding non-opioid alternatives. Patient advised to discuss non-opioid treatment plan with their provider. Functional ability and status Nutritional status Physical activity Advanced directives List of other physicians Hospitalizations, surgeries, and ER visits in previous 12  months Vitals Screenings to include cognitive, depression, and falls Referrals and appointments  In addition, I have reviewed and discussed with patient certain preventive protocols, quality metrics, and best practice recommendations. A written personalized care plan for preventive services as well as general preventive health recommendations were provided to patient.     Holden Maniscalco L Moishy Laday, CMA   07/03/2023   After Visit Summary: (MyChart) Due to this being a telephonic visit, the after visit summary with patients personalized plan was offered to patient via MyChart   Nurse Notes: Patient is up to date on all her health maintenance.  Patient would like to discuss her vaccines with Dr. Yetta Barre during her next office visit. She had no other concerns to address today.

## 2023-07-03 NOTE — Patient Instructions (Signed)
Ms. Leist , Thank you for taking time to come for your Medicare Wellness Visit. I appreciate your ongoing commitment to your health goals. Please review the following plan we discussed and let me know if I can assist you in the future.   Referrals/Orders/Follow-Ups/Clinician Recommendations: Remember to discuss the Covid booster with Dr. Yetta Barre during your next visit.  You are up to date on all your health maintenance.  It was nice to meet you today and keep up the good work.  This is a list of the screening recommended for you and due dates:  Health Maintenance  Topic Date Due   HIV Screening  Never done   Hepatitis C Screening  Never done   Pap Smear  Never done   COVID-19 Vaccine (6 - 2023-24 season) 07/05/2022   Medicare Annual Wellness Visit  07/02/2024   Cologuard (Stool DNA test)  05/15/2025   DTaP/Tdap/Td vaccine (3 - Td or Tdap) 05/17/2032   HPV Vaccine  Aged Out   Flu Shot  Discontinued    Advanced directives: (Provided) Advance directive discussed with you today. I have provided a copy for you to complete at home and have notarized. Once this is complete, please bring a copy in to our office so we can scan it into your chart.   Next Medicare Annual Wellness Visit scheduled for next year: Yes

## 2023-07-09 ENCOUNTER — Ambulatory Visit (INDEPENDENT_AMBULATORY_CARE_PROVIDER_SITE_OTHER): Payer: 59 | Admitting: Internal Medicine

## 2023-07-09 ENCOUNTER — Encounter: Payer: Self-pay | Admitting: Internal Medicine

## 2023-07-09 VITALS — BP 110/72 | HR 90 | Temp 98.2°F | Resp 16 | Ht 65.75 in | Wt 138.0 lb

## 2023-07-09 DIAGNOSIS — Z1159 Encounter for screening for other viral diseases: Secondary | ICD-10-CM | POA: Insufficient documentation

## 2023-07-09 DIAGNOSIS — E038 Other specified hypothyroidism: Secondary | ICD-10-CM

## 2023-07-09 DIAGNOSIS — Z114 Encounter for screening for human immunodeficiency virus [HIV]: Secondary | ICD-10-CM | POA: Insufficient documentation

## 2023-07-09 DIAGNOSIS — M797 Fibromyalgia: Secondary | ICD-10-CM

## 2023-07-09 DIAGNOSIS — Z23 Encounter for immunization: Secondary | ICD-10-CM | POA: Diagnosis not present

## 2023-07-09 DIAGNOSIS — Z0001 Encounter for general adult medical examination with abnormal findings: Secondary | ICD-10-CM

## 2023-07-09 DIAGNOSIS — Z0000 Encounter for general adult medical examination without abnormal findings: Secondary | ICD-10-CM

## 2023-07-09 DIAGNOSIS — E063 Autoimmune thyroiditis: Secondary | ICD-10-CM | POA: Diagnosis not present

## 2023-07-09 DIAGNOSIS — F3342 Major depressive disorder, recurrent, in full remission: Secondary | ICD-10-CM

## 2023-07-09 DIAGNOSIS — J45909 Unspecified asthma, uncomplicated: Secondary | ICD-10-CM | POA: Diagnosis not present

## 2023-07-09 DIAGNOSIS — G894 Chronic pain syndrome: Secondary | ICD-10-CM

## 2023-07-09 DIAGNOSIS — J4521 Mild intermittent asthma with (acute) exacerbation: Secondary | ICD-10-CM

## 2023-07-09 DIAGNOSIS — G7000 Myasthenia gravis without (acute) exacerbation: Secondary | ICD-10-CM

## 2023-07-09 LAB — COMPREHENSIVE METABOLIC PANEL
ALT: 24 U/L (ref 0–35)
AST: 29 U/L (ref 0–37)
Albumin: 4.2 g/dL (ref 3.5–5.2)
Alkaline Phosphatase: 38 U/L — ABNORMAL LOW (ref 39–117)
BUN: 10 mg/dL (ref 6–23)
CO2: 30 meq/L (ref 19–32)
Calcium: 9.3 mg/dL (ref 8.4–10.5)
Chloride: 102 meq/L (ref 96–112)
Creatinine, Ser: 0.7 mg/dL (ref 0.40–1.20)
GFR: 101.37 mL/min (ref >60.00)
Glucose, Bld: 95 mg/dL (ref 70–99)
Potassium: 4.3 meq/L (ref 3.5–5.1)
Sodium: 139 meq/L (ref 135–145)
Total Bilirubin: 0.4 mg/dL (ref 0.2–1.2)
Total Protein: 7.4 g/dL (ref 6.0–8.3)

## 2023-07-09 LAB — LIPID PANEL
Cholesterol: 172 mg/dL (ref 0–200)
HDL: 73.4 mg/dL (ref >39.00)
LDL Cholesterol: 88 mg/dL (ref 0–99)
NonHDL: 98.39
Total CHOL/HDL Ratio: 2
Triglycerides: 51 mg/dL (ref 0.0–149.0)
VLDL: 10.2 mg/dL (ref 0.0–40.0)

## 2023-07-09 LAB — TSH: TSH: 4.63 u[IU]/mL (ref 0.35–5.50)

## 2023-07-09 MED ORDER — QVAR REDIHALER 40 MCG/ACT IN AERB: 1.0000 | INHALATION_SPRAY | Freq: Two times a day (BID) | RESPIRATORY_TRACT | 1 refills | Status: AC

## 2023-07-09 MED ORDER — MONTELUKAST SODIUM 10 MG PO TABS
10.0000 mg | ORAL_TABLET | Freq: Every day | ORAL | 1 refills | Status: DC
Start: 2023-07-09 — End: 2024-01-08

## 2023-07-09 MED ORDER — DULOXETINE HCL 30 MG PO CPEP
90.0000 mg | ORAL_CAPSULE | Freq: Every day | ORAL | 1 refills | Status: DC
Start: 2023-07-09 — End: 2024-08-16

## 2023-07-09 MED ORDER — THYROID 120 MG PO TABS
120.0000 mg | ORAL_TABLET | Freq: Every day | ORAL | 0 refills | Status: AC
Start: 2023-07-09 — End: ?

## 2023-07-09 MED ORDER — AIRSUPRA 90-80 MCG/ACT IN AERO: 2.0000 | INHALATION_SPRAY | Freq: Four times a day (QID) | RESPIRATORY_TRACT | 3 refills | Status: DC | PRN

## 2023-07-09 NOTE — Progress Notes (Signed)
Subjective:  Patient ID: Kathryn Collier, female    DOB: 06-22-1971  Age: 52 y.o. MRN: [id number]  CC: Hypothyroidism, Depression, and Annual Exam   HPI Kathryn Collier presents for a CPX and f/up ---  Discussed the use of AI scribe software for clinical note transcription with the patient, who gave verbal consent to proceed.  History of Present Illness   The patient, with a known history of lupus and fibromyalgia, reports an increase in lupus activity, manifesting as musculoskeletal pain and stiffness, particularly in the upper spine and jaw. They describe the pain as fibromyalgia-like, exacerbated by temperature changes, travel, and lack of sleep. The patient anticipates an increase in body pain with the onset of colder weather.  The patient recently undertook a trip which triggered a flare-up, causing them to adapt their activities and lifestyle to manage the fatigue, muscle pain, and joint stiffness. They manage these symptoms with self-massage, essential oil salts, and Diclofenac.  The patient also reports a recent episode of increased pain and stiffness in the occipital joint and jaw, which affected their eating function, leading to a preference for liquid food. They also experienced a period of heavy chest and difficulty breathing, which they attribute to their lupus and not to congestion. They use a daily inhaler but have not needed their rescue inhaler.  The patient recently stopped taking birth control pills after 15 years and is monitoring for any resultant lupus flare-ups. They deny any red or swollen joints, bowel issues, or Cymbalta side effects. They report no issues with their thyroid dose.       Outpatient Medications Prior to Visit  Medication Sig Dispense Refill   b complex vitamins capsule Take by mouth.     Bromelains 500 MG TABS Take by mouth 3 (three) times daily.     buPROPion (WELLBUTRIN SR) 150 MG 12 hr tablet TAKE 1 TABLET(150 MG) BY MOUTH TWICE DAILY 180 tablet 3    Cholecalciferol 25 MCG (1000 UT) tablet Take by mouth.     cyclobenzaprine (FLEXERIL) 10 MG tablet Take 10 mg by mouth 3 (three) times daily as needed for muscle spasms.     diclofenac (VOLTAREN) 75 MG EC tablet Take 75 mg by mouth 2 (two) times daily.     hydroxychloroquine (PLAQUENIL) 200 MG tablet Take by mouth 2 (two) times daily.     NON FORMULARY      NON FORMULARY      NON FORMULARY 500 mg in the morning and at bedtime.     NON FORMULARY daily.     Omega-3 1000 MG CAPS Take by mouth.     OVER THE COUNTER MEDICATION      Albuterol-Budesonide (AIRSUPRA) 90-80 MCG/ACT AERO Inhale 2 puffs into the lungs 4 (four) times daily as needed. 10.7 g 3   beclomethasone (QVAR REDIHALER) 40 MCG/ACT inhaler Inhale 1 puff into the lungs 2 (two) times daily. 3 each 1   DULoxetine (CYMBALTA) 30 MG capsule TAKE 3 CAPSULE BY MOUTH DAILY 270 capsule 0   montelukast (SINGULAIR) 10 MG tablet Take 1 tablet (10 mg total) by mouth at bedtime. 90 tablet 0   thyroid (ARMOUR THYROID) 90 MG tablet Take 1 tablet (90 mg total) by mouth daily. TAKE 1 TABLET BY MOUTH EVERY DAY BEFORE BREAKFAST 30 tablet 0   fluconazole (DIFLUCAN) 150 MG tablet Take 1 tablet (150 mg total) by mouth daily. 1 tablet 3   norethindrone (MICRONOR) 0.35 MG tablet Take 1 tablet (0.35 mg total)  by mouth daily. 84 tablet 1   pyridostigmine (MESTINON) 60 MG tablet Take 0.5-1 tablets (30-60 mg total) by mouth 3 (three) times daily. 90 tablet 12   No facility-administered medications prior to visit.    ROS Review of Systems  Constitutional:  Positive for fatigue. Negative for appetite change, diaphoresis and unexpected weight change.  Respiratory:  Positive for wheezing. Negative for cough, chest tightness and shortness of breath.   Cardiovascular:  Negative for chest pain, palpitations and leg swelling.  Gastrointestinal: Negative.  Negative for abdominal pain, constipation, diarrhea and rectal pain.  Musculoskeletal:  Positive for  arthralgias, myalgias and neck pain. Negative for back pain, gait problem, joint swelling and neck stiffness.  Skin: Negative.  Negative for color change.  Hematological:  Negative for adenopathy.  Psychiatric/Behavioral:  Positive for dysphoric mood. Negative for suicidal ideas. The patient is not nervous/anxious.     Objective:  BP 110/72 (BP Location: Left Arm, Patient Position: Sitting, Cuff Size: Normal)   Pulse 90   Temp 98.2 F (36.8 C) (Oral)   Resp 16   Ht 5' 5.75" (1.67 m)   Wt 138 lb (62.6 kg)   LMP  (Approximate)   SpO2 95%   BMI 22.44 kg/m   BP Readings from Last 3 Encounters:  07/09/23 110/72  07/03/23 110/70  01/01/23 118/76    Wt Readings from Last 3 Encounters:  07/09/23 138 lb (62.6 kg)  07/03/23 135 lb 3.2 oz (61.3 kg)  01/01/23 134 lb (60.8 kg)    Physical Exam Vitals reviewed.  Constitutional:      Appearance: Normal appearance.  HENT:     Mouth/Throat:     Mouth: Mucous membranes are moist.  Eyes:     General: No scleral icterus.    Conjunctiva/sclera: Conjunctivae normal.  Cardiovascular:     Rate and Rhythm: Normal rate and regular rhythm.     Heart sounds: No murmur heard. Pulmonary:     Effort: Pulmonary effort is normal.     Breath sounds: No stridor. No wheezing, rhonchi or rales.  Abdominal:     General: Abdomen is flat.     Palpations: There is no mass.     Tenderness: There is no abdominal tenderness. There is no guarding.     Hernia: No hernia is present.  Musculoskeletal:        General: Normal range of motion.     Cervical back: Neck supple.     Right lower leg: No edema.     Left lower leg: No edema.  Lymphadenopathy:     Cervical: No cervical adenopathy.  Skin:    General: Skin is warm and dry.  Neurological:     General: No focal deficit present.     Mental Status: She is alert.  Psychiatric:        Mood and Affect: Mood normal.        Behavior: Behavior normal.     Lab Results  Component Value Date   WBC 7.7  01/01/2023   HGB 13.5 01/01/2023   HCT 39.6 01/01/2023   PLT 302.0 01/01/2023   GLUCOSE 95 07/09/2023   CHOL 172 07/09/2023   TRIG 51.0 07/09/2023   HDL 73.40 07/09/2023   LDLCALC 88 07/09/2023   ALT 24 07/09/2023   AST 29 07/09/2023   NA 139 07/09/2023   K 4.3 07/09/2023   CL 102 07/09/2023   CREATININE 0.70 07/09/2023   BUN 10 07/09/2023   CO2 30 07/09/2023   TSH 4.63  07/09/2023    MR Shoulder Left w/o contrast  Result Date: 10/24/2020 CLINICAL DATA:  Left shoulder pain with cracking and crunching for 10 years. Left arm weakness. No acute injury or prior relevant surgery. EXAM: MRI OF THE LEFT SHOULDER WITHOUT CONTRAST TECHNIQUE: Multiplanar, multisequence MR imaging of the shoulder was performed. No intravenous contrast was administered. COMPARISON:  Limited correlation made with a chest CT 06/24/2017. FINDINGS: Rotator cuff: Mild supraspinatus and infraspinatus tendinosis without tear. The subscapularis and teres minor tendons appear normal. Muscles:  No focal muscular atrophy or edema. Biceps long head:  Intact and normally positioned. Acromioclavicular Joint: The acromion is type 2. There are mild acromioclavicular degenerative changes. No significant fluid is present in the subacromial - subdeltoid bursa. Glenohumeral Joint: No significant shoulder joint effusion or glenohumeral arthropathy. Labrum: Labral assessment is limited by the lack of joint fluid. No evidence of labral tear or paralabral cyst. Bones: No acute or significant extra-articular osseous findings.Minimal subcortical cyst formation in the humeral head near the infraspinatus insertion. Other: Multiple prominent lymph nodes are noted within the left axilla and subpectoral space, similar to previous chest CT. On previous CT, these were present bilaterally. IMPRESSION: 1. Mild supraspinatus and infraspinatus tendinosis. No evidence of rotator cuff tear. 2. The labrum and biceps tendon appear intact. 3. Mild  acromioclavicular degenerative changes. 4. Multiple prominent lymph nodes within the left axilla and subpectoral space, similar to previous chest CT and presumably benign/reactive. Electronically Signed   By: Carey Bullocks M.D.   On: 10/24/2020 09:45    Assessment & Plan:   Hypothyroidism due to Hashimoto's thyroiditis-- Her TSH is 4.6 and she is symptomatic.  Will increase her T4 dosage. -     TSH; Future -     Lipid panel; Future -     Comprehensive metabolic panel; Future -     Thyroid; Take 1 tablet (120 mg total) by mouth daily before breakfast.  Dispense: 30 tablet; Refill: 0  Encounter for screening for HIV -     HIV Antibody (routine testing w rflx); Future  Need for hepatitis C screening test -     Hepatitis C antibody; Future  Flu vaccine need -     Flu vaccine trivalent PF, 6mos and older(Flulaval,Afluria,Fluarix,Fluzone)  Moderate asthma, unspecified whether complicated, unspecified whether persistent- Symptoms are adequately well-controlled. -     Comprehensive metabolic panel; Future -     Qvar RediHaler; Inhale 1 puff into the lungs 2 (two) times daily.  Dispense: 3 each; Refill: 1  Recurrent major depressive disorder, in full remission (HCC) -     Comprehensive metabolic panel; Future -     DULoxetine HCl; Take 3 capsules (90 mg total) by mouth daily.  Dispense: 270 capsule; Refill: 1  Mild intermittent asthma with exacerbation -     Airsupra; Inhale 2 puffs into the lungs 4 (four) times daily as needed.  Dispense: 10.7 g; Refill: 3 -     Montelukast Sodium; Take 1 tablet (10 mg total) by mouth at bedtime.  Dispense: 90 tablet; Refill: 1  Chronic pain disorder -     DULoxetine HCl; Take 3 capsules (90 mg total) by mouth daily.  Dispense: 270 capsule; Refill: 1 -     Ambulatory referral to Physical Therapy  Fibromyalgia -     DULoxetine HCl; Take 3 capsules (90 mg total) by mouth daily.  Dispense: 270 capsule; Refill: 1 -     Ambulatory referral to Physical  Therapy  Encounter for general adult  medical examination with abnormal findings - Exam completed, labs reviewed, vaccines reviewed and updated, cancer screenings are UTD, pt ed material was given.   Myasthenia gravis (HCC)- No evidence of disease activity.     Follow-up: Return in about 6 months (around 01/06/2024).  Sanda Linger, MD

## 2023-07-09 NOTE — Patient Instructions (Signed)

## 2023-07-10 ENCOUNTER — Encounter: Payer: Self-pay | Admitting: Internal Medicine

## 2023-07-10 ENCOUNTER — Other Ambulatory Visit: Payer: Self-pay | Admitting: Internal Medicine

## 2023-07-10 DIAGNOSIS — E063 Autoimmune thyroiditis: Secondary | ICD-10-CM

## 2023-07-10 MED ORDER — THYROID 120 MG PO TABS
120.0000 mg | ORAL_TABLET | Freq: Every day | ORAL | 0 refills | Status: DC
Start: 2023-07-10 — End: 2023-07-16

## 2023-07-16 ENCOUNTER — Other Ambulatory Visit: Payer: Self-pay | Admitting: Internal Medicine

## 2023-07-16 ENCOUNTER — Encounter: Payer: Self-pay | Admitting: Internal Medicine

## 2023-07-16 DIAGNOSIS — E038 Other specified hypothyroidism: Secondary | ICD-10-CM

## 2023-07-16 MED ORDER — THYROID 120 MG PO TABS
120.0000 mg | ORAL_TABLET | Freq: Every day | ORAL | 1 refills | Status: DC
Start: 2023-07-16 — End: 2024-01-06

## 2023-07-19 LAB — STRIATED MUSCLE ANTIBODY: STRIATED MUSCLE AB SCREEN: NEGATIVE

## 2023-07-19 LAB — TEST AUTHORIZATION

## 2023-07-19 LAB — HEPATITIS C ANTIBODY: Hepatitis C Ab: NONREACTIVE

## 2023-07-19 LAB — HIV ANTIBODY (ROUTINE TESTING W REFLEX): HIV 1&2 Ab, 4th Generation: NONREACTIVE

## 2023-07-19 LAB — ACETYLCHOLINE RECEPTOR, BINDING: A CHR BINDING ABS: 1.76 nmol/L — ABNORMAL HIGH

## 2023-07-30 ENCOUNTER — Ambulatory Visit: Payer: 59 | Attending: Internal Medicine

## 2023-07-30 ENCOUNTER — Other Ambulatory Visit: Payer: Self-pay

## 2023-07-30 DIAGNOSIS — M542 Cervicalgia: Secondary | ICD-10-CM | POA: Diagnosis present

## 2023-07-30 DIAGNOSIS — G894 Chronic pain syndrome: Secondary | ICD-10-CM | POA: Diagnosis not present

## 2023-07-30 DIAGNOSIS — M797 Fibromyalgia: Secondary | ICD-10-CM | POA: Insufficient documentation

## 2023-07-30 NOTE — Therapy (Signed)
OUTPATIENT PHYSICAL THERAPY EVALUATION   Patient Name: Kathryn Collier MRN: [id number] DOB:[birth year], 52 y.o., female Today's Date: 07/30/2023  END OF SESSION:  PT End of Session - 07/30/23 1008     Visit Number 1    Number of Visits 8    Date for PT Re-Evaluation 09/24/23    Authorization Type [employer]/MCD    PT Start Time 1002    PT Stop Time 1045    PT Time Calculation (min) 43 min    Activity Tolerance Patient tolerated treatment well    Behavior During Therapy WFL for tasks assessed/performed             Past Medical History:  Diagnosis Date   Connective tissue disease (HCC)    Connective tissue disease (HCC)    undifferentiated   Hashimoto's disease    Ocular myasthenia (HCC)    Systemic lupus (HCC)    Past Surgical History:  Procedure Laterality Date   WISDOM TOOTH EXTRACTION     Patient Active Problem List   Diagnosis Date Noted   Need for hepatitis C screening test 07/09/2023   Encounter for screening for HIV 07/09/2023   Flu vaccine need 07/09/2023   Mild intermittent asthma with exacerbation 01/01/2023   Encounter for initial prescription of contraceptive pills 05/10/2022   Fibromyalgia 05/09/2022   Chronic pain disorder 05/09/2022   Screen for colon cancer 05/09/2022   Screening mammogram for breast cancer 05/09/2022   Severe episode of recurrent major depressive disorder, without psychotic features (HCC) 05/08/2022   Moderate asthma 11/08/2019   Immunocompromised (HCC) 06/07/2019   Recurrent major depressive disorder, in full remission (HCC) 06/07/2019   Myasthenia gravis (HCC) 12/23/2017   Herpes simplex 06/17/2017   Hashimoto's disease 12/06/2016   Vitamin D deficiency 05/31/2016   Systemic lupus erythematosus (HCC) 10/26/2015   Hypothyroidism 10/26/2015   Long-term use of hydroxychloroquine 10/26/2015   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    PCP: Etta Grandchild, MD  REFERRING PROVIDER: Etta Grandchild,  MD  REFERRING DIAG: Chronic pain disorder [G89.4], Fibromyalgia [M79.7]   THERAPY DIAG:  Cervicalgia  Rationale for Evaluation and Treatment: Rehabilitation  ONSET DATE: 07/09/23 referral date  SUBJECTIVE:                                                                                                                                                                                                         SUBJECTIVE STATEMENT: Patient reports that she had a flare up of her cervical symptoms following travel at the beginning of the month. She is  would like to have TPDN d/t to previous positive response of similar symptoms. She has previously experiencing jaw pain related to Lupus and states that her dentist evaluated for possible TMJ, but her current understanding is that it is Lupus-related. She states that she was seen by chiropractor since date of referral and feels that she received some relief from use of [place], e-stim and chiropractic adjustment(s). She is interested in establishing care with PT for these symptoms, including stiffness in her neck.    Hand dominance: Right  PERTINENT HISTORY:  PMHx includes undifferentiated connective tissue disease, hashimoto's disease, systemic lupus, ocular myasthenia, fibromyalgia, asthma, major depressive disorder, myasthenia gravis, hypothyroidism   PAIN:  Are you having pain? Yes Pain location: neck, jaw  Relieving factors: self-massage, essential oil salts, and Diclofenac   PRECAUTIONS: None  RED FLAGS: None     WEIGHT BEARING RESTRICTIONS: No  FALLS:  Has patient fallen in last 6 months? Yes. Number of falls 0  LIVING ENVIRONMENT: Lives with: lives alone  OCCUPATION: n/a  PLOF: Independent, Independent with basic ADLs, and Independent with community mobility without device  PATIENT GOALS: To improve pain, decrease stiffness   NEXT MD VISIT: 01/06/24 with PCP  OBJECTIVE:    PATIENT SURVEYS:  Cervical FOTO 72 current    COGNITION: Overall cognitive status: Within functional limits for tasks assessed  SENSATION: Not tested  POSTURE: forward head, mild shoulder rounding    CERVICAL ROM:   Active ROM A/PROM (deg) eval  Flexion 40  Extension 40  Right lateral flexion 20  Left lateral flexion 40  Right rotation 58  Left rotation 66    UPPER EXTREMITY ROM: grossly WFL bilaterally; pt reports feeling more mm tension with L shoulder elevation >165   UPPER EXTREMITY MMT:  MMT Right eval Left eval  Shoulder flexion 4+ 4+  Shoulder extension    Shoulder abduction 4+ 4+  Shoulder adduction    Shoulder extension    Shoulder internal rotation 4+ 4+  Shoulder external rotation 4 4+  Middle trapezius 4- 4-  Lower trapezius 4 3+  Grip strength     (Blank rows = not tested)  CERVICAL SPECIAL TESTS:  Not tested    TODAY'S TREATMENT:                                                                                                                               [facility] Adult PT Treatment:                                                DATE: 07/30/2023  Initial evaluation: see patient education and home exercise program as noted below     PATIENT EDUCATION:  Education details: reviewed initial home exercise program; discussion of POC, prognosis and goals for skilled PT   Person educated: Patient  Education method: Explanation, Demonstration, and Handouts Education comprehension: verbalized understanding, returned demonstration, and needs further education  HOME EXERCISE PROGRAM: Access Code: [email or web address] URL: [email or web address] Date: 07/30/2023 Prepared by: Mauri Reading  Exercises - Standing Isometric Cervical Retraction with Ulyess Blossom and Ball at [employer]  - 1 x daily - 7 x weekly - 2 sets - 10 reps - 3 sec hold - Seated Assisted Cervical Rotation with Towel  - 1 x daily - 7 x weekly - 2 sets - 10 reps - 2 sec hold - Prone Middle Trapezius Strengthening on Swiss Ball  - 1 x  daily - 7 x weekly - 2 sets - 10 reps  ASSESSMENT:  CLINICAL IMPRESSION: Patient is a 52 y.o. female who was seen today for physical therapy evaluation and treatment for Neck Pain with mobility deficits, and related PMHx of fibromyagia, myasthenia gravis, and hashimoto's disease with hypothyroidism. She is demonstrating diminished CS AROM, diminished UE MMT, and periscapular MMT. She has related pain and difficulty with looking up, looking over her shoulder, heavy lifting/carrying, chewing food, and working overhead. She requires skilled PT services at this time to address relevant deficits and improve overall function.     OBJECTIVE IMPAIRMENTS: decreased activity tolerance, decreased ROM, decreased strength, impaired UE functional use, postural dysfunction, and pain.   ACTIVITY LIMITATIONS: carrying, lifting, and reach over head  PARTICIPATION LIMITATIONS: meal prep, cleaning, driving, shopping, and community activity  PERSONAL FACTORS: Past/current experiences, Time since onset of injury/illness/exacerbation, and 3+ comorbidities: PMHx includes undifferentiated connective tissue disease, hashimoto's disease, systemic lupus, ocular myasthenia, fibromyalgia, asthma, major depressive disorder, myasthenia gravis, hypothyroidism  are also affecting patient's functional outcome.   REHAB POTENTIAL: Fair    CLINICAL DECISION MAKING: Evolving/moderate complexity  EVALUATION COMPLEXITY: Moderate   GOALS: Goals reviewed with patient? Yes  SHORT TERM GOALS: Target date: 08/27/2023    Patient will be independent with initial home program for cervical mobility, UE/periscapular strengthening.  Baseline: initiated at eval  Goal status: INITIAL  2.  Patient will demonstrate improved postural awareness for at least 15 minutes while seated without need for cueing from PT.   Baseline: forward head, mild shoulder rounding  Goal status: INITIAL   LONG TERM GOALS: Target date:  09/24/2023   Patient will report minimal to no difficulty with eating solid foods.  Baseline:  Goal status: INITIAL  2.  Patient will report minimal to no difficulty performing repetitive activities overhead as related to normal daily activities.  Baseline:  Goal status: INITIAL  3. Patient will demonstrate improved CS AROM as noted below.  Baseline:  Active ROM A/PROM (deg) eval GOAL  Flexion 40 45  Extension 40 45  Right lateral flexion 20 30  Left lateral flexion 40   Right rotation 58 65  Left rotation 66    Goal status: INITIAL  4.  Patient will demonstrate improved UQ strength with 4+/5 MMT or greater bilaterally.  Baseline:  MMT Right eval Left eval  Shoulder flexion 4+ 4+  Shoulder extension    Shoulder abduction 4+ 4+  Shoulder adduction    Shoulder extension    Shoulder internal rotation 4+ 4+  Shoulder external rotation 4 4+  Middle trapezius 4- 4-  Lower trapezius 4 3+  Grip strength     Goal status: INITIAL   PLAN:  PT FREQUENCY: 1x/week  PT DURATION: 8 weeks  PLANNED INTERVENTIONS: Therapeutic exercises, Therapeutic activity, Neuromuscular re-education, Patient/Family education, Self Care, Joint mobilization, Joint manipulation, Dry Needling, Electrical stimulation, Spinal manipulation, Spinal mobilization,  Cryotherapy, Moist heat, Taping, Manual therapy, and Re-evaluation  PLAN FOR NEXT SESSION: assess need for TPDN and provide if indicated, manual therapy to address CS mobility deficits, pain, BIL UE and periscapular strength/stability activities. Updated HEP as appropriate.    Mauri Reading, PT, DPT  07/30/2023, 5:25 PM

## 2023-08-07 NOTE — Therapy (Addendum)
 OUTPATIENT PHYSICAL THERAPY TREATMENT/DISCHARGE NOTE  PHYSICAL THERAPY DISCHARGE SUMMARY  Visits from Start of Care: 2  Current functional level related to goals / functional outcomes: Unknown   Remaining deficits: unknown   Education / Equipment: Initial HEP   Patient agrees to discharge. Patient goals were not met. Patient is being discharged due to not returning since the last visit.    Patient Name: Kathryn Collier MRN: [id number] DOB:02-18-1971, 52 y.o., female Today's Date: 08/08/2023  END OF SESSION:  PT End of Session - 08/08/23 1018     Visit Number 2    Number of Visits 8    Date for PT Re-Evaluation 09/24/23    Authorization Type UHC/MCD    PT Start Time 1018    PT Stop Time 1100    PT Time Calculation (min) 42 min    Activity Tolerance Patient tolerated treatment well    Behavior During Therapy WFL for tasks assessed/performed              Past Medical History:  Diagnosis Date   Connective tissue disease (HCC)    Connective tissue disease (HCC)    undifferentiated   Hashimoto's disease    Ocular myasthenia (HCC)    Systemic lupus (HCC)    Past Surgical History:  Procedure Laterality Date   WISDOM TOOTH EXTRACTION     Patient Active Problem List   Diagnosis Date Noted   Need for hepatitis C screening test 07/09/2023   Encounter for screening for HIV 07/09/2023   Flu vaccine need 07/09/2023   Mild intermittent asthma with exacerbation 01/01/2023   Encounter for initial prescription of contraceptive pills 05/10/2022   Fibromyalgia 05/09/2022   Chronic pain disorder 05/09/2022   Screen for colon cancer 05/09/2022   Screening mammogram for breast cancer 05/09/2022   Severe episode of recurrent major depressive disorder, without psychotic features (HCC) 05/08/2022   Moderate asthma 11/08/2019   Immunocompromised (HCC) 06/07/2019   Recurrent major depressive disorder, in full remission (HCC) 06/07/2019   Myasthenia gravis (HCC) 12/23/2017    Herpes simplex 06/17/2017   Hashimoto's disease 12/06/2016   Vitamin D deficiency 05/31/2016   Systemic lupus erythematosus (HCC) 10/26/2015   Hypothyroidism 10/26/2015   Long-term use of hydroxychloroquine 10/26/2015   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 She is would like to have TPDN d/t to previous positive response of similar symptoms. She has previously experiencing jaw pain related to Lupus and states that her dentist evaluated for possible TMJ, but her current understanding is that it is Lupus-related. She states that she was seen by chiropractor since date of referral and feels that she received some relief from use of [place], e-stim and chiropractic adjustment(s). She is interested in establishing care with PT for these symptoms, including stiffness in her neck.    Hand dominance: Right  PERTINENT HISTORY:  PMHx includes undifferentiated connective tissue disease, hashimoto's disease, systemic lupus, ocular myasthenia, fibromyalgia, asthma, major depressive disorder, myasthenia gravis, hypothyroidism   PAIN:  Are you having pain? Yes Pain location: neck, jaw  Relieving factors: self-massage, essential oil salts, and Diclofenac   PRECAUTIONS: None  RED FLAGS: None     WEIGHT BEARING  RESTRICTIONS: No  FALLS:  Has patient fallen in last 6 months? Yes. Number of falls 0  LIVING ENVIRONMENT: Lives with: lives alone  OCCUPATION: n/a  PLOF: Independent, Independent with basic ADLs, and Independent with community mobility without device  PATIENT GOALS: To improve pain, decrease stiffness   NEXT MD VISIT: 01/06/24 with PCP  OBJECTIVE:    PATIENT SURVEYS:  Cervical FOTO 72 current   COGNITION: Overall cognitive status: Within functional limits for tasks assessed  SENSATION: Not tested  POSTURE: forward head, mild shoulder rounding    CERVICAL ROM:   Active ROM A/PROM (deg) eval  Flexion 40  Extension 40  Right lateral flexion 20  Left lateral flexion 40  Right rotation 58  Left rotation 66    UPPER EXTREMITY ROM: grossly WFL bilaterally; pt reports feeling more mm tension with L shoulder elevation >165   UPPER EXTREMITY MMT:  MMT Right eval Left eval  Shoulder flexion 4+ 4+  Shoulder extension    Shoulder abduction 4+ 4+  Shoulder adduction    Shoulder extension    Shoulder internal rotation 4+ 4+  Shoulder external rotation 4 4+  Middle trapezius 4- 4-  Lower trapezius 4 3+  Grip strength     (Blank rows = not tested)  CERVICAL SPECIAL TESTS:  Not tested    TODAY'S TREATMENT:                                                                                                                              [facility] Adult PT Treatment:                                                DATE: 08-08-23 Therapeutic Exercise:  Standing Isometric Cervical Retraction with Chin Tucks and Ball at [employer]   10 reps - 3 sec hold with VC Seated Assisted Cervical Rotation with Towel 5 reps - 2 sec hold Prone Middle Trapezius Strengthening on Swiss Ball  10 x with visible muscle fatigue at 10 Manual Therapy:  Suboccipital release STW to bil UT, masseters bil, bil scalenes Trigger Point Dry-Needling performed     by Garen Lah Treatment instructions: Expect  mild to moderate muscle soreness. S/S of pneumothorax if dry needled over a lung field, and to seek immediate medical attention should they occur. Patient verbalized understanding of these instructions and education.  Patient Consent Given: Yes Education handout provided: Previously provided Muscles treated: bil masseter with e stim then TPDN with bil scalenes and bil UT/LS and bil masseters Electrical stimulation performed: Yes Parameters: 25 pps to pt tolerance (minimal) Treatment response/outcome: twitch response noted, pt noted relief Modalities  moist hot pack  [facility] Adult PT Treatment:                                                DATE: 07/30/2023  Initial evaluation: see patient education and home exercise program as noted below     PATIENT EDUCATION:  Education details: reviewed initial home exercise program; discussion of POC, prognosis and goals for skilled PT   Person educated: Patient Education method: Explanation, Demonstration, and Handouts Education comprehension: verbalized understanding, returned demonstration, and needs further education  HOME EXERCISE PROGRAM: Access Code: [email or web address] URL: [email or web address] Date: 07/30/2023 Prepared by: Mauri Reading  Exercises - Standing Isometric Cervical Retraction with Ulyess Blossom and Ball at [employer]  - 1 x daily - 7 x weekly - 2 sets - 10 reps - 3 sec hold - Seated Assisted Cervical Rotation with Towel  - 1 x daily - 7 x weekly - 2 sets - 10 reps - 2 sec hold - Prone Middle Trapezius Strengthening on Swiss Ball  - 1 x daily - 7 x weekly - 2 sets - 10 reps  ASSESSMENT:  CLINICAL IMPRESSION: Pt returns to clinic with tension in bil scalenes, UT/LS bil and bil masseters. Pt consents to TPDN and is closely monitored throughout session.  Pt reviewed HEP and had decreased muscle tension post RX but increased soreness due to TPDN  Will continue POC.    EVAL- Patient is a 52 y.o. female who was seen today for physical  therapy evaluation and treatment for Neck Pain with mobility deficits, and related PMHx of fibromyagia, myasthenia gravis, and hashimoto's disease with hypothyroidism. She is demonstrating diminished CS AROM, diminished UE MMT, and periscapular MMT. She has related pain and difficulty with looking up, looking over her shoulder, heavy lifting/carrying, chewing food, and working overhead. She requires skilled PT services at this time to address relevant deficits and improve overall function.     OBJECTIVE IMPAIRMENTS: decreased activity tolerance, decreased ROM, decreased strength, impaired UE functional use, postural dysfunction, and pain.   ACTIVITY LIMITATIONS: carrying, lifting, and reach over head  PARTICIPATION LIMITATIONS: meal prep, cleaning, driving, shopping, and community activity  PERSONAL FACTORS: Past/current experiences, Time since onset of injury/illness/exacerbation, and 3+ comorbidities: PMHx includes undifferentiated connective tissue disease, hashimoto's disease, systemic lupus, ocular myasthenia, fibromyalgia, asthma, major depressive disorder, myasthenia gravis, hypothyroidism  are also affecting patient's functional outcome.   REHAB POTENTIAL: Fair    CLINICAL DECISION MAKING: Evolving/moderate  complexity  EVALUATION COMPLEXITY: Moderate   GOALS: Goals reviewed with patient? Yes  SHORT TERM GOALS: Target date: 08/27/2023    Patient will be independent with initial home program for cervical mobility, UE/periscapular strengthening.  Baseline: initiated at eval  Goal status: INITIAL  2.  Patient will demonstrate improved postural awareness for at least 15 minutes while seated without need for cueing from PT.   Baseline: forward head, mild shoulder rounding  Goal status: INITIAL   LONG TERM GOALS: Target date: 09/24/2023   Patient will report minimal to no difficulty with eating solid foods.  Baseline:  Goal status: INITIAL  2.  Patient will report minimal to  no difficulty performing repetitive activities overhead as related to normal daily activities.  Baseline:  Goal status: INITIAL  3. Patient will demonstrate improved CS AROM as noted below.  Baseline:  Active ROM A/PROM (deg) eval GOAL  Flexion 40 45  Extension 40 45  Right lateral flexion 20 30  Left lateral flexion 40   Right rotation 58 65  Left rotation 66    Goal status: INITIAL  4.  Patient will demonstrate improved UQ strength with 4+/5 MMT or greater bilaterally.  Baseline:  MMT Right eval Left eval  Shoulder flexion 4+ 4+  Shoulder extension    Shoulder abduction 4+ 4+  Shoulder adduction    Shoulder extension    Shoulder internal rotation 4+ 4+  Shoulder external rotation 4 4+  Middle trapezius 4- 4-  Lower trapezius 4 3+  Grip strength     Goal status: INITIAL   PLAN:  PT FREQUENCY: 1x/week  PT DURATION: 8 weeks  PLANNED INTERVENTIONS: Therapeutic exercises, Therapeutic activity, Neuromuscular re-education, Patient/Family education, Self Care, Joint mobilization, Joint manipulation, Dry Needling, Electrical stimulation, Spinal manipulation, Spinal mobilization, Cryotherapy, Moist heat, Taping, Manual therapy, and Re-evaluation  PLAN FOR NEXT SESSION: assess need for TPDN and provide if indicated, manual therapy to address CS mobility deficits, pain, BIL UE and periscapular strength/stability activities. Updated HEP as appropriate.    Garen Lah, PT, ATRIC Certified Exercise Expert for the Aging Adult  08/08/23 11:55 AM Phone: [phone] Fax: [phone]     Garen Lah, PT, ATRIC Certified Exercise Expert for the Aging Adult  01/01/24 2:11 PM Phone: [phone] Fax: [phone]

## 2023-08-08 ENCOUNTER — Encounter: Payer: Self-pay | Admitting: Physical Therapy

## 2023-08-08 ENCOUNTER — Ambulatory Visit: Payer: 59 | Attending: Internal Medicine | Admitting: Physical Therapy

## 2023-08-08 DIAGNOSIS — M542 Cervicalgia: Secondary | ICD-10-CM | POA: Insufficient documentation

## 2023-08-08 NOTE — Patient Instructions (Signed)

## 2023-08-12 ENCOUNTER — Ambulatory Visit: Payer: 59 | Admitting: Physical Therapy

## 2023-08-26 ENCOUNTER — Ambulatory Visit: Payer: 59 | Admitting: Physical Therapy

## 2023-09-02 ENCOUNTER — Telehealth: Payer: Self-pay | Admitting: Internal Medicine

## 2023-09-02 ENCOUNTER — Other Ambulatory Visit: Payer: Self-pay

## 2023-09-02 ENCOUNTER — Encounter: Payer: Self-pay | Admitting: Internal Medicine

## 2023-09-02 MED ORDER — BUPROPION HCL ER (SR) 150 MG PO TB12: ORAL_TABLET | ORAL | 0 refills | Status: DC

## 2023-09-02 NOTE — Telephone Encounter (Signed)
Prescription Request  09/02/2023  LOV: 07/09/2023  What is the name of the medication or equipment? Buproprion - wellbutrin  Have you contacted your pharmacy to request a refill? Yes   Which pharmacy would you like this sent to?  [id number] [place] Otto, [place] - [address] AT [facility] [address] [place] [address] Phone: [phone] Fax: [phone]    Patient notified that their request is being sent to the clinical staff for review and that they should receive a response within 2 business days.   Please advise at Mobile [phone] (mobile)

## 2023-09-02 NOTE — Telephone Encounter (Signed)
 Medication refill has been sent to Dr. Yetta Barre

## 2023-09-11 ENCOUNTER — Ambulatory Visit: Payer: 59 | Admitting: Physical Therapy

## 2023-10-13 ENCOUNTER — Encounter: Payer: Self-pay | Admitting: Internal Medicine

## 2023-11-25 ENCOUNTER — Other Ambulatory Visit: Payer: Self-pay | Admitting: Internal Medicine

## 2023-12-02 DIAGNOSIS — H04123 Dry eye syndrome of bilateral lacrimal glands: Secondary | ICD-10-CM | POA: Diagnosis not present

## 2023-12-02 DIAGNOSIS — G7000 Myasthenia gravis without (acute) exacerbation: Secondary | ICD-10-CM | POA: Diagnosis not present

## 2023-12-02 DIAGNOSIS — Z79899 Other long term (current) drug therapy: Secondary | ICD-10-CM | POA: Diagnosis not present

## 2023-12-02 DIAGNOSIS — H524 Presbyopia: Secondary | ICD-10-CM | POA: Diagnosis not present

## 2023-12-02 DIAGNOSIS — H53143 Visual discomfort, bilateral: Secondary | ICD-10-CM | POA: Diagnosis not present

## 2023-12-03 ENCOUNTER — Encounter: Payer: Self-pay | Admitting: Internal Medicine

## 2023-12-06 DIAGNOSIS — U071 COVID-19: Secondary | ICD-10-CM | POA: Diagnosis not present

## 2023-12-06 DIAGNOSIS — R051 Acute cough: Secondary | ICD-10-CM | POA: Diagnosis not present

## 2023-12-06 DIAGNOSIS — J029 Acute pharyngitis, unspecified: Secondary | ICD-10-CM | POA: Diagnosis not present

## 2023-12-10 DIAGNOSIS — G7000 Myasthenia gravis without (acute) exacerbation: Secondary | ICD-10-CM | POA: Diagnosis not present

## 2023-12-10 DIAGNOSIS — R0602 Shortness of breath: Secondary | ICD-10-CM | POA: Diagnosis not present

## 2023-12-10 DIAGNOSIS — M791 Myalgia, unspecified site: Secondary | ICD-10-CM | POA: Diagnosis not present

## 2023-12-10 DIAGNOSIS — Z6823 Body mass index (BMI) 23.0-23.9, adult: Secondary | ICD-10-CM | POA: Diagnosis not present

## 2023-12-10 DIAGNOSIS — M329 Systemic lupus erythematosus, unspecified: Secondary | ICD-10-CM | POA: Diagnosis not present

## 2023-12-10 DIAGNOSIS — I7300 Raynaud's syndrome without gangrene: Secondary | ICD-10-CM | POA: Diagnosis not present

## 2024-01-06 ENCOUNTER — Ambulatory Visit: Payer: 59 | Admitting: Internal Medicine

## 2024-01-06 ENCOUNTER — Encounter: Payer: Self-pay | Admitting: Internal Medicine

## 2024-01-06 ENCOUNTER — Telehealth: Payer: Self-pay | Admitting: *Deleted

## 2024-01-06 VITALS — BP 120/72 | HR 84 | Temp 98.0°F | Ht 65.75 in | Wt 141.4 lb

## 2024-01-06 DIAGNOSIS — Z136 Encounter for screening for cardiovascular disorders: Secondary | ICD-10-CM | POA: Diagnosis not present

## 2024-01-06 DIAGNOSIS — F1010 Alcohol abuse, uncomplicated: Secondary | ICD-10-CM | POA: Insufficient documentation

## 2024-01-06 DIAGNOSIS — E063 Autoimmune thyroiditis: Secondary | ICD-10-CM | POA: Diagnosis not present

## 2024-01-06 DIAGNOSIS — J45909 Unspecified asthma, uncomplicated: Secondary | ICD-10-CM

## 2024-01-06 DIAGNOSIS — Z23 Encounter for immunization: Secondary | ICD-10-CM

## 2024-01-06 DIAGNOSIS — J302 Other seasonal allergic rhinitis: Secondary | ICD-10-CM | POA: Insufficient documentation

## 2024-01-06 LAB — HEPATIC FUNCTION PANEL
ALT: 35 U/L (ref 0–35)
AST: 41 U/L — ABNORMAL HIGH (ref 0–37)
Albumin: 4.4 g/dL (ref 3.5–5.2)
Alkaline Phosphatase: 45 U/L (ref 39–117)
Bilirubin, Direct: 0.2 mg/dL (ref 0.0–0.3)
Total Bilirubin: 0.8 mg/dL (ref 0.2–1.2)
Total Protein: 7.6 g/dL (ref 6.0–8.3)

## 2024-01-06 LAB — CBC WITH DIFFERENTIAL/PLATELET
Basophils Absolute: 0 10*3/uL (ref 0.0–0.1)
Basophils Relative: 0.5 % (ref 0.0–3.0)
Eosinophils Absolute: 0.1 10*3/uL (ref 0.0–0.7)
Eosinophils Relative: 1.2 % (ref 0.0–5.0)
HCT: 40.9 % (ref 36.0–46.0)
Hemoglobin: 13.6 g/dL (ref 12.0–15.0)
Lymphocytes Relative: 32.4 % (ref 12.0–46.0)
Lymphs Abs: 1.9 10*3/uL (ref 0.7–4.0)
MCHC: 33.2 g/dL (ref 30.0–36.0)
MCV: 98.4 fl (ref 78.0–100.0)
Monocytes Absolute: 0.6 10*3/uL (ref 0.1–1.0)
Monocytes Relative: 10 % (ref 3.0–12.0)
Neutro Abs: 3.3 10*3/uL (ref 1.4–7.7)
Neutrophils Relative %: 55.9 % (ref 43.0–77.0)
Platelets: 304 10*3/uL (ref 150.0–400.0)
RBC: 4.15 Mil/uL (ref 3.87–5.11)
RDW: 14 % (ref 11.5–15.5)
WBC: 5.8 10*3/uL (ref 4.0–10.5)

## 2024-01-06 LAB — BASIC METABOLIC PANEL
BUN: 13 mg/dL (ref 6–23)
CO2: 31 meq/L (ref 19–32)
Calcium: 9.4 mg/dL (ref 8.4–10.5)
Chloride: 101 meq/L (ref 96–112)
Creatinine, Ser: 0.61 mg/dL (ref 0.40–1.20)
GFR: 104.42 mL/min (ref >60.00)
Glucose, Bld: 90 mg/dL (ref 70–99)
Potassium: 4.1 meq/L (ref 3.5–5.1)
Sodium: 137 meq/L (ref 135–145)

## 2024-01-06 LAB — TSH: TSH: 7.6 u[IU]/mL — ABNORMAL HIGH (ref 0.35–5.50)

## 2024-01-06 MED ORDER — LEVOTHYROXINE SODIUM 25 MCG PO TABS: 25.0000 ug | ORAL_TABLET | Freq: Every day | ORAL | 0 refills | Status: DC

## 2024-01-06 MED ORDER — LEVOTHYROXINE SODIUM 200 MCG PO TABS: 200.0000 ug | ORAL_TABLET | Freq: Every day | ORAL | 0 refills | Status: DC

## 2024-01-06 NOTE — Patient Instructions (Signed)

## 2024-01-06 NOTE — Progress Notes (Signed)
 Complex Care Management Note  Care Guide Note 01/06/2024 Name: LULIE HURD MRN: [id number] DOB: Sep 03, 1971  Vivien Rota is a 53 y.o. year old female who sees Etta Grandchild, MD for primary care. I reached out to Vivien Rota by phone today to offer complex care management services.  Ms. Melendrez was given information about Complex Care Management services today including:   The Complex Care Management services include support from the care team which includes your Nurse Care Manager, Clinical Social Worker, or Pharmacist.  The Complex Care Management team is here to help remove barriers to the health concerns and goals most important to you. Complex Care Management services are voluntary, and the patient may decline or stop services at any time by request to their care team member.   Complex Care Management Consent Status: Patient agreed to services and verbal consent obtained.   Follow up plan:  Telephone appointment with complex care management team member scheduled for:  01/09/2024  Encounter Outcome:  Patient Scheduled  Burman Nieves, CMA, Care Guide [facility]  [facility], [facility] Guide Direct Dial: [phone]  Fax: [phone] Website: [email or web address]

## 2024-01-06 NOTE — Progress Notes (Unsigned)
 Subjective:  Patient ID: Kathryn Collier, female    DOB: 11-30-1970  Age: 53 y.o. MRN: [id number]  CC: Hypothyroidism and Asthma   HPI Kathryn Collier presents for f/up ---  Discussed the use of AI scribe software for clinical note transcription with the patient, who gave verbal consent to proceed.  History of Present Illness   Kathryn Collier is a 53 year old female who presents for a private discussion.  She is here for a private discussion. No additional details about the specific concerns or topics of the discussion are provided.   She is concerned about her alcohol intake, recently up to 4 glasses of wine per night. She is interested in treatment options.       Outpatient Medications Prior to Visit  Medication Sig Dispense Refill   Albuterol-Budesonide (AIRSUPRA) 90-80 MCG/ACT AERO Inhale 2 puffs into the lungs 4 (four) times daily as needed. 10.7 g 3   b complex vitamins capsule Take by mouth.     beclomethasone (QVAR REDIHALER) 40 MCG/ACT inhaler Inhale 1 puff into the lungs 2 (two) times daily. 3 each 1   Bromelains 500 MG TABS Take by mouth 3 (three) times daily.     buPROPion (WELLBUTRIN SR) 150 MG 12 hr tablet TAKE 1 TABLET(150 MG) BY MOUTH TWICE DAILY 180 tablet 0   Cholecalciferol 25 MCG (1000 UT) tablet Take by mouth.     cyclobenzaprine (FLEXERIL) 10 MG tablet Take 10 mg by mouth 3 (three) times daily as needed for muscle spasms.     diclofenac (VOLTAREN) 75 MG EC tablet Take 75 mg by mouth 2 (two) times daily.     DULoxetine (CYMBALTA) 30 MG capsule Take 3 capsules (90 mg total) by mouth daily. 270 capsule 1   hydroxychloroquine (PLAQUENIL) 200 MG tablet Take by mouth 2 (two) times daily.     montelukast (SINGULAIR) 10 MG tablet Take 1 tablet (10 mg total) by mouth at bedtime. 90 tablet 1   NON FORMULARY      NON FORMULARY      NON FORMULARY 500 mg in the morning and at bedtime.     NON FORMULARY daily.     Omega-3 1000 MG CAPS Take by mouth.     OVER THE COUNTER  MEDICATION      thyroid (ARMOUR THYROID) 120 MG tablet Take 1 tablet (120 mg total) by mouth daily before breakfast. 90 tablet 1   No facility-administered medications prior to visit.    ROS Review of Systems  Constitutional:  Positive for fatigue. Negative for appetite change, diaphoresis and unexpected weight change.  HENT: Negative.    Respiratory: Negative.  Negative for chest tightness, shortness of breath and wheezing.   Cardiovascular:  Negative for chest pain, palpitations and leg swelling.  Gastrointestinal:  Negative for abdominal pain, constipation, diarrhea, nausea and vomiting.  Genitourinary: Negative.  Negative for difficulty urinating.  Musculoskeletal:  Positive for arthralgias and myalgias. Negative for back pain, joint swelling and neck stiffness.  Skin: Negative.   Neurological: Negative.  Negative for dizziness and weakness.  Hematological:  Negative for adenopathy. Does not bruise/bleed easily.  Psychiatric/Behavioral: Negative.      Objective:  BP 120/72 (BP Location: Left Arm, Patient Position: Sitting, Cuff Size: Normal)   Pulse 84   Temp 98 F (36.7 C) (Oral)   Ht 5' 5.75" (1.67 m)   Wt 141 lb 6.4 oz (64.1 kg)   SpO2 96%   BMI 23.00 kg/m   BP  Readings from Last 3 Encounters:  01/06/24 120/72  07/09/23 110/72  07/03/23 110/70    Wt Readings from Last 3 Encounters:  01/06/24 141 lb 6.4 oz (64.1 kg)  07/09/23 138 lb (62.6 kg)  07/03/23 135 lb 3.2 oz (61.3 kg)    Physical Exam Vitals reviewed.  Constitutional:      Appearance: Normal appearance.  HENT:     Nose: Nose normal.     Mouth/Throat:     Mouth: Mucous membranes are moist.  Eyes:     General: No scleral icterus.    Conjunctiva/sclera: Conjunctivae normal.  Cardiovascular:     Rate and Rhythm: Normal rate and regular rhythm.     Heart sounds: No murmur heard.    No friction rub. No gallop.     Comments: EKG---  NSR, 79 bpm No LVH, Q waves, or ST/T wave changes  No old  tracings to compare Pulmonary:     Effort: Pulmonary effort is normal.     Breath sounds: No stridor. No wheezing, rhonchi or rales.  Abdominal:     General: Abdomen is flat.     Palpations: There is no mass.     Tenderness: There is no abdominal tenderness. There is no guarding.     Hernia: No hernia is present.  Musculoskeletal:        General: Normal range of motion.     Cervical back: Neck supple.     Right lower leg: No edema.     Left lower leg: No edema.  Lymphadenopathy:     Cervical: No cervical adenopathy.  Skin:    General: Skin is warm and dry.     Coloration: Skin is not pale.  Neurological:     General: No focal deficit present.     Mental Status: She is alert and oriented to person, place, and time. Mental status is at baseline.  Psychiatric:        Mood and Affect: Mood normal.        Behavior: Behavior normal.        Thought Content: Thought content normal.        Judgment: Judgment normal.     Lab Results  Component Value Date   WBC 5.8 01/06/2024   HGB 13.6 01/06/2024   HCT 40.9 01/06/2024   PLT 304.0 01/06/2024   GLUCOSE 90 01/06/2024   CHOL 172 07/09/2023   TRIG 51.0 07/09/2023   HDL 73.40 07/09/2023   LDLCALC 88 07/09/2023   ALT 35 01/06/2024   AST 41 (H) 01/06/2024   NA 137 01/06/2024   K 4.1 01/06/2024   CL 101 01/06/2024   CREATININE 0.61 01/06/2024   BUN 13 01/06/2024   CO2 31 01/06/2024   TSH 7.60 (H) 01/06/2024    MR Shoulder Left w/o contrast Result Date: 10/24/2020 CLINICAL DATA:  Left shoulder pain with cracking and crunching for 10 years. Left arm weakness. No acute injury or prior relevant surgery. EXAM: MRI OF THE LEFT SHOULDER WITHOUT CONTRAST TECHNIQUE: Multiplanar, multisequence MR imaging of the shoulder was performed. No intravenous contrast was administered. COMPARISON:  Limited correlation made with a chest CT 06/24/2017. FINDINGS: Rotator cuff: Mild supraspinatus and infraspinatus tendinosis without tear. The  subscapularis and teres minor tendons appear normal. Muscles:  No focal muscular atrophy or edema. Biceps long head:  Intact and normally positioned. Acromioclavicular Joint: The acromion is type 2. There are mild acromioclavicular degenerative changes. No significant fluid is present in the subacromial - subdeltoid bursa. Glenohumeral Joint:  No significant shoulder joint effusion or glenohumeral arthropathy. Labrum: Labral assessment is limited by the lack of joint fluid. No evidence of labral tear or paralabral cyst. Bones: No acute or significant extra-articular osseous findings.Minimal subcortical cyst formation in the humeral head near the infraspinatus insertion. Other: Multiple prominent lymph nodes are noted within the left axilla and subpectoral space, similar to previous chest CT. On previous CT, these were present bilaterally. IMPRESSION: 1. Mild supraspinatus and infraspinatus tendinosis. No evidence of rotator cuff tear. 2. The labrum and biceps tendon appear intact. 3. Mild acromioclavicular degenerative changes. 4. Multiple prominent lymph nodes within the left axilla and subpectoral space, similar to previous chest CT and presumably benign/reactive. Electronically Signed   By: Carey Bullocks M.D.   On: 10/24/2020 09:45   Procedure Note  Shivaji, Reginia Naas, MD - 12/06/2023 Formatting of this note might be different from the original. XR CHEST 2 VIEWS, 12/06/2023 11:14 AM  INDICATION:Acute cough \ R05.1 Acute cough COMPARISON: None.  FINDINGS:  Cardiovascular: Cardiac silhouette and pulmonary vasculature are within normal limits. Mediastinum: Within normal limits.     Lungs/pleura: No focal airspace disease.   No pleural effusion or pneumothorax. Upper abdomen: Unremarkable appearance of the upper abdomen. Chest wall/osseous structures: No acute osseous abnormality. Supportive devices: None.   IMPRESSION:  No acute cardiopulmonary abnormality. Exam End: 12/06/23 11:14    Specimen Collected: 12/06/23 11:29 Last Resulted: 12/06/23 11:29  Received From: [facility]  Result Received: 01/01/24 14:09    Assessment & Plan:  Hypothyroidism due to Hashimoto thyroiditis -     Basic metabolic panel; Future -     TSH; Future -     Levothyroxine Sodium; Take 1 tablet (200 mcg total) by mouth daily.  Dispense: 90 tablet; Refill: 0 -     Levothyroxine Sodium; Take 1 tablet (25 mcg total) by mouth daily.  Dispense: 90 tablet; Refill: 0  Moderate asthma, unspecified whether complicated, unspecified whether persistent -     Basic metabolic panel; Future -     CBC with Differential/Platelet; Future  Alcohol use disorder, mild, abuse -     AMB Referral [facility] Care Management -     Hepatic function panel; Future  Screening for ischemic heart disease -     EKG 12-Lead     Follow-up: Return in about 6 months (around 07/08/2024).  Sanda Linger, MD

## 2024-01-07 DIAGNOSIS — Z23 Encounter for immunization: Secondary | ICD-10-CM | POA: Insufficient documentation

## 2024-01-07 MED ORDER — SHINGRIX 50 MCG/0.5ML IM SUSR: 0.5000 mL | Freq: Once | INTRAMUSCULAR | 1 refills | Status: AC

## 2024-01-08 ENCOUNTER — Other Ambulatory Visit: Payer: Self-pay | Admitting: Internal Medicine

## 2024-01-08 DIAGNOSIS — J4521 Mild intermittent asthma with (acute) exacerbation: Secondary | ICD-10-CM

## 2024-01-09 ENCOUNTER — Ambulatory Visit: Payer: Self-pay | Admitting: Licensed Clinical Social Worker

## 2024-01-09 NOTE — Patient Instructions (Signed)
 Visit Information  Thank you for taking time to visit with me today. Please don't hesitate to contact me if I can be of assistance to you.   Following are the goals we discussed today:   Goals Addressed             This Visit's Progress    Reduce symptoms of Substance Use       Care Coordination Interventions: Assessed Social Determinants of Health Reviewed all upcoming appointments in Epic system Motivational Interviewing employed Solution-Focused Strategies employed:  Active listening / Reflection utilized  Emotional Support Provided Problem Solving /Task Center strategies reviewed Provided general psycho-education for mental health needs  Consider AA/NA meetings with friend Establish goals for substance use reduction           Please call the care guide team at [phone] if you need to cancel or reschedule your appointment.   If you are experiencing a Mental Health or Behavioral Health Crisis or need someone to talk to, please call the Suicide and Crisis Lifeline: 988  Patient verbalizes understanding of instructions and care plan provided today and agrees to view in MyChart. Active MyChart status and patient understanding of how to access instructions and care plan via MyChart confirmed with patient.

## 2024-01-09 NOTE — Patient Outreach (Signed)
 Care Coordination   Initial Visit Note   01/09/2024 Name: Kathryn Collier MRN: [id number] DOB: 1971/10/15  Kathryn Collier is a 53 y.o. year old female who sees Etta Grandchild, MD for primary care. I spoke with  Kathryn Collier by phone today.  What matters to the patients health and wellness today? Strengthening my coping skills around substance use.    Goals Addressed             This Visit's Progress    Reduce symptoms of Substance Use       Care Coordination Interventions: Assessed Social Determinants of Health Reviewed all upcoming appointments in Epic system Motivational Interviewing employed Solution-Focused Strategies employed:  Active listening / Reflection utilized  Emotional Support Provided Problem Solving /Task Center strategies reviewed Provided general psycho-education for mental health needs  Consider AA/NA meetings with friend Establish goals for substance use reduction          SDOH assessments and interventions completed:  Yes     Care Coordination Interventions:  Yes, provided   Interventions Today    Flowsheet Row Most Recent Value  Chronic Disease   Chronic disease during today's visit Other  [Substance Use]  General Interventions   General Interventions Discussed/Reviewed [facility], General Interventions Discussed  [CSW and pt discussed NA/AA meetings that are available in the area. Pt plans on attending meetings with her friend who is also in recovery.]  Mental Health Interventions   Mental Health Discussed/Reviewed Mental Health Discussed, Mental Health Reviewed, Substance Abuse, Coping Strategies  [We discussed pt's current substance use habits, also discussed general education on addicition. Also discussed replacing habits related to substance use and leaning on supports for accountability.]        Follow up plan: Follow up call scheduled for 02/06/2024    Encounter Outcome:  Patient Visit Completed   Kenton Kingfisher, LCSW [facility], [facility] Licensed Clinical Social Worker Care Coordinator [phone]

## 2024-01-15 DIAGNOSIS — G7000 Myasthenia gravis without (acute) exacerbation: Secondary | ICD-10-CM | POA: Diagnosis not present

## 2024-02-06 ENCOUNTER — Ambulatory Visit: Payer: Self-pay | Admitting: Licensed Clinical Social Worker

## 2024-02-06 NOTE — Patient Outreach (Signed)
 Care Coordination   Follow Up Visit Note   02/06/2024 Name: Kathryn Collier MRN: [id number] DOB: 07/16/1971  Kathryn Collier is a 53 y.o. year old female who sees Etta Grandchild, MD for primary care. I spoke with  Kathryn Collier by phone today.  What matters to the patients health and wellness today?  Care Coordination    Goals Addressed             This Visit's Progress    COMPLETED: Reduce symptoms of Substance Use       Care Coordination Interventions: Assessed Social Determinants of Health Reviewed all upcoming appointments in Epic system Motivational Interviewing employed Solution-Focused Strategies employed:  Active listening / Reflection utilized  Emotional Support Provided Problem Solving /Task Center strategies reviewed Provided general psycho-education for mental health needs  Consider AA/NA meetings with friend Establish goals for substance use reduction          SDOH assessments and interventions completed:  Yes     Care Coordination Interventions:  Yes, provided   Follow up plan: No further intervention required.   Encounter Outcome:  Patient Visit Completed   Kenton Kingfisher, LCSW [facility], [facility] Licensed Clinical Social Worker Care Coordinator [phone]

## 2024-02-17 DIAGNOSIS — R87619 Unspecified abnormal cytological findings in specimens from cervix uteri: Secondary | ICD-10-CM | POA: Insufficient documentation

## 2024-02-20 ENCOUNTER — Other Ambulatory Visit: Payer: Self-pay | Admitting: Internal Medicine

## 2024-02-26 DIAGNOSIS — B880 Other acariasis: Secondary | ICD-10-CM | POA: Diagnosis not present

## 2024-02-26 DIAGNOSIS — H01004 Unspecified blepharitis left upper eyelid: Secondary | ICD-10-CM | POA: Diagnosis not present

## 2024-03-16 ENCOUNTER — Other Ambulatory Visit: Payer: Self-pay | Admitting: Internal Medicine

## 2024-04-08 ENCOUNTER — Other Ambulatory Visit: Payer: Self-pay | Admitting: Internal Medicine

## 2024-04-08 DIAGNOSIS — E063 Autoimmune thyroiditis: Secondary | ICD-10-CM

## 2024-04-09 ENCOUNTER — Other Ambulatory Visit: Payer: Self-pay | Admitting: Internal Medicine

## 2024-04-09 DIAGNOSIS — E063 Autoimmune thyroiditis: Secondary | ICD-10-CM

## 2024-04-12 ENCOUNTER — Other Ambulatory Visit: Payer: Self-pay | Admitting: Internal Medicine

## 2024-04-12 DIAGNOSIS — E063 Autoimmune thyroiditis: Secondary | ICD-10-CM

## 2024-04-12 NOTE — Telephone Encounter (Signed)
 Copied from CRM [id number]. Topic: Clinical - Medication Refill >> Apr 12, 2024  4:33 PM Sophia H wrote: Medication: levothyroxine  (SYNTHROID ) 200 MCG tablet levothyroxine  (SYNTHROID ) 25 MCG tablet  Has the patient contacted their pharmacy? Yes, pharmacy told the patient the medication was denied & to contact their doctors office.    This is the patient's preferred pharmacy:  [facility] #[id number] [place], [place] - [address] AT [facility] [address] [place] [address] Phone: [phone] Fax: [phone]  Is this the correct pharmacy for this prescription? Yes If no, delete pharmacy and type the correct one.   Has the prescription been filled recently? Yes  Is the patient out of the medication? No, enough to last till tomorrow 06/10.   Has the patient been seen for an appointment in the last year OR does the patient have an upcoming appointment? Yes, August   Can we respond through MyChart? Yes  Agent: Please be advised that Rx refills may take up to 3 business days. We ask that you follow-up with your pharmacy.

## 2024-04-14 MED ORDER — LEVOTHYROXINE SODIUM 25 MCG PO TABS
25.0000 ug | ORAL_TABLET | Freq: Every day | ORAL | 0 refills | Status: DC
Start: 2024-04-14 — End: 2024-07-06

## 2024-04-14 MED ORDER — LEVOTHYROXINE SODIUM 200 MCG PO TABS: 200.0000 ug | ORAL_TABLET | Freq: Every day | ORAL | 0 refills | Status: DC

## 2024-06-11 ENCOUNTER — Other Ambulatory Visit: Payer: Self-pay | Admitting: Internal Medicine

## 2024-06-16 DIAGNOSIS — I7300 Raynaud's syndrome without gangrene: Secondary | ICD-10-CM | POA: Diagnosis not present

## 2024-06-16 DIAGNOSIS — G7000 Myasthenia gravis without (acute) exacerbation: Secondary | ICD-10-CM | POA: Diagnosis not present

## 2024-06-16 DIAGNOSIS — Z6822 Body mass index (BMI) 22.0-22.9, adult: Secondary | ICD-10-CM | POA: Diagnosis not present

## 2024-06-16 DIAGNOSIS — M329 Systemic lupus erythematosus, unspecified: Secondary | ICD-10-CM | POA: Diagnosis not present

## 2024-06-16 DIAGNOSIS — R0602 Shortness of breath: Secondary | ICD-10-CM | POA: Diagnosis not present

## 2024-06-16 DIAGNOSIS — M791 Myalgia, unspecified site: Secondary | ICD-10-CM | POA: Diagnosis not present

## 2024-06-16 LAB — BASIC METABOLIC PANEL WITH GFR
BUN: 10 (ref 4–21)
Creatinine: 0.7 (ref 0.5–1.1)
Glucose: 78

## 2024-06-16 LAB — COMPREHENSIVE METABOLIC PANEL WITH GFR: eGFR: 104

## 2024-06-16 LAB — CBC AND DIFFERENTIAL: Hemoglobin: 13.4 (ref 12.0–16.0)

## 2024-07-03 ENCOUNTER — Other Ambulatory Visit: Payer: Self-pay | Admitting: Internal Medicine

## 2024-07-03 DIAGNOSIS — J4521 Mild intermittent asthma with (acute) exacerbation: Secondary | ICD-10-CM

## 2024-07-03 DIAGNOSIS — E063 Autoimmune thyroiditis: Secondary | ICD-10-CM

## 2024-07-12 ENCOUNTER — Other Ambulatory Visit: Payer: Self-pay | Admitting: Internal Medicine

## 2024-07-12 DIAGNOSIS — E063 Autoimmune thyroiditis: Secondary | ICD-10-CM

## 2024-07-12 NOTE — Telephone Encounter (Unsigned)
 Copied from CRM [id number]. Topic: Clinical - Medication Refill >> Jul 12, 2024  4:13 PM Leah C wrote: Medication: levothyroxine  (SYNTHROID ) 200 MCG tablet [Pharmacy Med Name: LEVOTHYROXINE  0.2MG  ( ) TAB]   Has the patient contacted their pharmacy? yes (Agent: If no, request that the patient contact the pharmacy for the refill. If patient does not wish to contact the pharmacy document the reason why and proceed with request.) (Agent: If yes, when and what did the pharmacy advise?)  This is the patient's preferred pharmacy:  [facility] #[id number] [address], [place] - [address] AT [facility] OF [address] [place] [address] Phone: [phone] Fax: [phone]  Is this the correct pharmacy for this prescription? Yes If no, delete pharmacy and type the correct one.   Has the prescription been filled recently? Yes  Is the patient out of the medication? Yes  Has the patient been seen for an appointment in the last year OR does the patient have an upcoming appointment? Yes  Can we respond through MyChart? Yes  Agent: Please be advised that Rx refills may take up to 3 business days. We ask that you follow-up with your pharmacy.

## 2024-07-14 ENCOUNTER — Encounter: Payer: Self-pay | Admitting: Internal Medicine

## 2024-07-14 ENCOUNTER — Ambulatory Visit: Payer: Self-pay | Admitting: Internal Medicine

## 2024-07-14 ENCOUNTER — Ambulatory Visit (INDEPENDENT_AMBULATORY_CARE_PROVIDER_SITE_OTHER): Admitting: Internal Medicine

## 2024-07-14 VITALS — BP 108/78 | HR 94 | Temp 98.5°F | Resp 16 | Ht 65.75 in | Wt 138.6 lb

## 2024-07-14 DIAGNOSIS — E063 Autoimmune thyroiditis: Secondary | ICD-10-CM

## 2024-07-14 DIAGNOSIS — I950 Idiopathic hypotension: Secondary | ICD-10-CM

## 2024-07-14 DIAGNOSIS — Z1231 Encounter for screening mammogram for malignant neoplasm of breast: Secondary | ICD-10-CM

## 2024-07-14 LAB — CORTISOL: Cortisol, Plasma: 11.9 ug/dL

## 2024-07-14 LAB — TSH: TSH: 0 u[IU]/mL — ABNORMAL LOW (ref 0.35–5.50)

## 2024-07-14 NOTE — Patient Instructions (Signed)

## 2024-07-14 NOTE — Progress Notes (Signed)
 "  Subjective:  Patient ID: Kathryn Collier, female    DOB: 07-30-1971  Age: 53 y.o. MRN: [id number]  CC: Hypothyroidism   HPI [name] presents for f/up -----  Discussed the use of AI scribe software for clinical note transcription with the patient, who gave verbal consent to proceed.  History of Present Illness Kathryn Collier is a 53 year old female with asthma and hypothyroidism who presents for follow-up of her asthma management and medication issues.  She experienced asthma flare-ups from April to July, attributed to seasonal changes, with symptoms improving after July. During flare-ups, she occasionally used her rescue inhaler but discontinued Qvar  due to recurrent thrush, despite rinsing her mouth after use. She is interested in alternative medications that do not cause thrush. She has not needed her emergency inhaler recently and reports no chest pain, shortness of breath, dizziness, or lightheadedness during physical activities like swimming.  She takes a total of 225 micrograms of thyroid  medication daily, consisting of two different prescriptions. She reports issues with her pharmacy filling both prescriptions simultaneously, as they are often treated as separate. She experiences fatigue but denies constipation, suggesting her current dose is adequate.  She mentions a history of low blood pressure, typically around 110/75 mmHg, and low cholesterol. She reports a past EKG within the last year and a mammogram approximately two years ago. She also describes a painful cervical procedure in April, which she believes may have contributed to a flare-up, along with a sprained ankle that was swollen but not painful, unlike her muscles and joints.    Outpatient Medications Prior to Visit  Medication Sig Dispense Refill   Albuterol-Budesonide (AIRSUPRA ) 90-80 MCG/ACT AERO Inhale 2 puffs into the lungs 4 (four) times daily as needed. 10.7 g 3   b complex vitamins capsule Take by mouth.      beclomethasone (QVAR  REDIHALER) 40 MCG/ACT inhaler Inhale 1 puff into the lungs 2 (two) times daily. 3 each 1   Bromelains 500 MG TABS Take by mouth 3 (three) times daily.     buPROPion  (WELLBUTRIN  SR) 150 MG 12 hr tablet TAKE 1 TABLET(150 MG) BY MOUTH TWICE DAILY 180 tablet 0   Cholecalciferol 25 MCG (1000 UT) tablet Take by mouth.     cyclobenzaprine (FLEXERIL) 10 MG tablet Take 10 mg by mouth 3 (three) times daily as needed for muscle spasms.     diclofenac (VOLTAREN) 75 MG EC tablet Take 75 mg by mouth 2 (two) times daily.     DULoxetine  (CYMBALTA ) 30 MG capsule Take 3 capsules (90 mg total) by mouth daily. 270 capsule 1   hydroxychloroquine (PLAQUENIL) 200 MG tablet Take by mouth 2 (two) times daily. (Patient taking differently: Take 200 mg by mouth daily.)     levothyroxine  (SYNTHROID ) 200 MCG tablet TAKE 1 TABLET(200 MCG) BY MOUTH DAILY 90 tablet 0   montelukast  (SINGULAIR ) 10 MG tablet TAKE 1 TABLET(10 MG) BY MOUTH AT BEDTIME 90 tablet 0   NON FORMULARY      NON FORMULARY      NON FORMULARY 500 mg in the morning and at bedtime.     NON FORMULARY daily.     Omega-3 1000 MG CAPS Take by mouth.     OVER THE COUNTER MEDICATION      pyridostigmine  (MESTINON ) 60 MG tablet Take 30 mg by mouth as needed.     levothyroxine  (SYNTHROID ) 25 MCG tablet TAKE 1 TABLET(25 MCG) BY MOUTH DAILY 90 tablet 0   No facility-administered  medications prior to visit.    ROS Review of Systems  Constitutional:  Negative for appetite change, chills, diaphoresis, fatigue and fever.  HENT: Negative.    Respiratory:  Negative for cough, chest tightness, shortness of breath and wheezing.   Cardiovascular:  Negative for chest pain, palpitations and leg swelling.  Gastrointestinal:  Negative for abdominal pain, blood in stool, constipation, diarrhea, nausea and vomiting.  Endocrine: Negative for cold intolerance and heat intolerance.  Genitourinary:  Negative for difficulty urinating and dysuria.   Musculoskeletal:  Positive for arthralgias and myalgias.  Neurological:  Negative for dizziness, weakness and light-headedness.  Hematological:  Negative for adenopathy. Does not bruise/bleed easily.  Psychiatric/Behavioral:  Positive for dysphoric mood. Negative for confusion, decreased concentration, sleep disturbance and suicidal ideas. The patient is nervous/anxious.     Objective:  BP 108/78 (BP Location: Left Arm, Patient Position: Sitting, Cuff Size: Normal)   Pulse 94   Temp 98.5 F (36.9 C) (Oral)   Resp 16   Ht 5' 5.75 (1.67 m)   Wt 138 lb 9.6 oz (62.9 kg)   SpO2 98%   BMI 22.54 kg/m   BP Readings from Last 3 Encounters:  07/14/24 108/78  01/06/24 120/72  07/09/23 110/72    Wt Readings from Last 3 Encounters:  07/14/24 138 lb 9.6 oz (62.9 kg)  01/06/24 141 lb 6.4 oz (64.1 kg)  07/09/23 138 lb (62.6 kg)    Physical Exam Vitals reviewed.  Constitutional:      Appearance: Normal appearance.  HENT:     Nose: Nose normal.     Mouth/Throat:     Mouth: Mucous membranes are moist.  Eyes:     General: No scleral icterus.    Conjunctiva/sclera: Conjunctivae normal.  Cardiovascular:     Rate and Rhythm: Normal rate and regular rhythm.     Heart sounds: No murmur heard.    No friction rub. No gallop.  Pulmonary:     Effort: Pulmonary effort is normal.     Breath sounds: No stridor. No wheezing, rhonchi or rales.  Abdominal:     General: Abdomen is flat.     Palpations: There is no mass.     Tenderness: There is no abdominal tenderness. There is no guarding.     Hernia: No hernia is present.  Musculoskeletal:        General: Normal range of motion.     Cervical back: Neck supple.     Right lower leg: No edema.     Left lower leg: No edema.  Lymphadenopathy:     Cervical: No cervical adenopathy.  Skin:    General: Skin is warm and dry.  Neurological:     General: No focal deficit present.     Mental Status: She is alert. Mental status is at baseline.   Psychiatric:        Mood and Affect: Mood normal.        Behavior: Behavior normal.     Lab Results  Component Value Date   WBC 5.8 01/06/2024   HGB 13.4 06/16/2024   HCT 40.9 01/06/2024   PLT 304.0 01/06/2024   GLUCOSE 90 01/06/2024   CHOL 172 07/09/2023   TRIG 51.0 07/09/2023   HDL 73.40 07/09/2023   LDLCALC 88 07/09/2023   ALT 35 01/06/2024   AST 41 (H) 01/06/2024   NA 137 01/06/2024   K 4.1 01/06/2024   CL 101 01/06/2024   CREATININE 0.7 06/16/2024   BUN 10 06/16/2024   CO2  31 01/06/2024   TSH 0.00 Repeated and verified X2. (L) 07/14/2024    MR Shoulder Left w/o contrast Result Date: 10/24/2020 CLINICAL DATA:  Left shoulder pain with cracking and crunching for 10 years. Left arm weakness. No acute injury or prior relevant surgery. EXAM: MRI OF THE LEFT SHOULDER WITHOUT CONTRAST TECHNIQUE: Multiplanar, multisequence MR imaging of the shoulder was performed. No intravenous contrast was administered. COMPARISON:  Limited correlation made with a chest CT 06/24/2017. FINDINGS: Rotator cuff: Mild supraspinatus and infraspinatus tendinosis without tear. The subscapularis and teres minor tendons appear normal. Muscles:  No focal muscular atrophy or edema. Biceps long head:  Intact and normally positioned. Acromioclavicular Joint: The acromion is type 2. There are mild acromioclavicular degenerative changes. No significant fluid is present in the subacromial - subdeltoid bursa. Glenohumeral Joint: No significant shoulder joint effusion or glenohumeral arthropathy. Labrum: Labral assessment is limited by the lack of joint fluid. No evidence of labral tear or paralabral cyst. Bones: No acute or significant extra-articular osseous findings.Minimal subcortical cyst formation in the humeral head near the infraspinatus insertion. Other: Multiple prominent lymph nodes are noted within the left axilla and subpectoral space, similar to previous chest CT. On previous CT, these were present  bilaterally. IMPRESSION: 1. Mild supraspinatus and infraspinatus tendinosis. No evidence of rotator cuff tear. 2. The labrum and biceps tendon appear intact. 3. Mild acromioclavicular degenerative changes. 4. Multiple prominent lymph nodes within the left axilla and subpectoral space, similar to previous chest CT and presumably benign/reactive. Electronically Signed   By: Elsie Perone M.D.   On: 10/24/2020 09:45    Assessment & Plan:   Hypothyroidism due to Hashimoto thyroiditis- Her TSH is suppressed. Will lower her T4 dose. -     TSH; Future  Idiopathic hypotension- Labs are negative for secondary causes. -     TSH; Future -     Cortisol; Future  Screening mammogram for breast cancer -     Digital Screening Mammogram, Left and Right; Future     Follow-up: Return in about 6 months (around 01/11/2025).  Debby Molt, MD "

## 2024-07-15 ENCOUNTER — Encounter: Payer: Self-pay | Admitting: Internal Medicine

## 2024-08-14 ENCOUNTER — Other Ambulatory Visit: Payer: Self-pay | Admitting: Internal Medicine

## 2024-08-14 DIAGNOSIS — F3342 Major depressive disorder, recurrent, in full remission: Secondary | ICD-10-CM

## 2024-08-14 DIAGNOSIS — G894 Chronic pain syndrome: Secondary | ICD-10-CM

## 2024-08-14 DIAGNOSIS — M797 Fibromyalgia: Secondary | ICD-10-CM

## 2024-08-18 ENCOUNTER — Other Ambulatory Visit: Payer: Self-pay | Admitting: Internal Medicine

## 2024-08-20 ENCOUNTER — Ambulatory Visit

## 2024-08-20 VITALS — Ht 65.75 in | Wt 138.0 lb

## 2024-08-20 DIAGNOSIS — Z0000 Encounter for general adult medical examination without abnormal findings: Secondary | ICD-10-CM | POA: Diagnosis not present

## 2024-08-20 NOTE — Progress Notes (Signed)
 Subjective:   Kathryn Collier is a 53 y.o. who presents for a Medicare Wellness preventive visit.  As a reminder, Annual Wellness Visits don't include a physical exam, and some assessments may be limited, especially if this visit is performed virtually. We may recommend an in-person follow-up visit with your provider if needed.  Visit Complete: Virtual I connected with  Kathryn Collier on 08/20/24 by a audio enabled telemedicine application and verified that I am speaking with the correct person using two identifiers.  Patient Location: Home  Provider Location: Office/Clinic  I discussed the limitations of evaluation and management by telemedicine. The patient expressed understanding and agreed to proceed.  Vital Signs: Because this visit was a virtual/telehealth visit, some criteria may be missing or patient reported. Any vitals not documented were not able to be obtained and vitals that have been documented are patient reported.  VideoDeclined- This patient declined Librarian, academic. Therefore the visit was completed with audio only.  Persons Participating in Visit: Patient.  AWV Questionnaire: Yes: Patient Medicare AWV questionnaire was completed by the patient on 07/07/2024; I have confirmed that all information answered by patient is correct and no changes since this date.  Cardiac Risk Factors include: advanced age (>45men, >40 women);Other (see comment), Risk factor comments: Hypothyroidism, Hashimoto's disease, Systemic lupus erythematosus     Objective:    Today's Vitals   08/20/24 1540  Weight: 138 lb (62.6 kg)  Height: 5' 5.75 (1.67 m)   Body mass index is 22.44 kg/m.     08/20/2024    3:44 PM 07/03/2023    1:17 PM 07/07/2020    9:18 AM 11/30/2019   10:18 AM 11/29/2014   11:10 AM  Advanced Directives  Does Patient Have a Medical Advance Directive? No No No No No   Would patient like information on creating a medical advance directive?   Yes (MAU/Ambulatory/Procedural Areas - Information given) No - Patient declined No - Patient declined Yes - Educational materials given      Data saved with a previous flowsheet row definition    Current Medications (verified) Outpatient Encounter Medications as of 08/20/2024  Medication Sig   Albuterol-Budesonide (AIRSUPRA ) 90-80 MCG/ACT AERO Inhale 2 puffs into the lungs 4 (four) times daily as needed.   b complex vitamins capsule Take by mouth.   beclomethasone (QVAR  REDIHALER) 40 MCG/ACT inhaler Inhale 1 puff into the lungs 2 (two) times daily.   Bromelains 500 MG TABS Take by mouth 3 (three) times daily.   buPROPion  (WELLBUTRIN  SR) 150 MG 12 hr tablet TAKE 1 TABLET(150 MG) BY MOUTH TWICE DAILY   Cholecalciferol 25 MCG (1000 UT) tablet Take by mouth.   cyclobenzaprine (FLEXERIL) 10 MG tablet Take 10 mg by mouth 3 (three) times daily as needed for muscle spasms.   diclofenac (VOLTAREN) 75 MG EC tablet Take 75 mg by mouth 2 (two) times daily.   DULoxetine  (CYMBALTA ) 30 MG capsule TAKE 3 CAPSULE BY MOUTH DAILY   hydroxychloroquine (PLAQUENIL) 200 MG tablet Take by mouth 2 (two) times daily. (Patient taking differently: Take 200 mg by mouth daily.)   levothyroxine  (SYNTHROID ) 200 MCG tablet TAKE 1 TABLET(200 MCG) BY MOUTH DAILY   montelukast  (SINGULAIR ) 10 MG tablet TAKE 1 TABLET(10 MG) BY MOUTH AT BEDTIME   NON FORMULARY    NON FORMULARY    NON FORMULARY 500 mg in the morning and at bedtime.   NON FORMULARY daily.   Omega-3 1000 MG CAPS Take by mouth.  OVER THE COUNTER MEDICATION    pyridostigmine  (MESTINON ) 60 MG tablet Take 30 mg by mouth as needed.   No facility-administered encounter medications on file as of 08/20/2024.    Allergies (verified) Corticosteroids, Other, Prednisone, Sertraline, and Sulfa antibiotics   History: Past Medical History:  Diagnosis Date   Connective tissue disease    Connective tissue disease    undifferentiated   Hashimoto's disease    Ocular  myasthenia    Systemic lupus (HCC)    Past Surgical History:  Procedure Laterality Date   WISDOM TOOTH EXTRACTION     Family History  Problem Relation Age of Onset   Brain cancer Mother    Cancer - Lung Father    Social History   Socioeconomic History   Marital status: Single    Spouse name: Not on file   Number of children: 0   Years of education: 17   Highest education level: Bachelor's degree (e.g., BA, AB, BS)  Occupational History   Occupation: Disabled  Tobacco Use   Smoking status: Never    Passive exposure: Never   Smokeless tobacco: Never  Vaping Use   Vaping status: Never Used  Substance and Sexual Activity   Alcohol use: Yes    Alcohol/week: 18.0 standard drinks of alcohol    Types: 16 Glasses of wine, 2 Cans of beer per week   Drug use: Yes    Types: Marijuana   Sexual activity: Not Currently    Partners: Male  Other Topics Concern   Not on file  Social History Narrative   Lives alone/2025   Caffeine- coffee, 1 cup daily   Social Drivers of Health   Financial Resource Strain: Medium Risk (07/07/2024)   Overall Financial Resource Strain (CARDIA)    Difficulty of Paying Living Expenses: Somewhat hard  Food Insecurity: Food Insecurity Present (07/07/2024)   Hunger Vital Sign    Worried About Running Out of Food in the Last Year: Sometimes true    Ran Out of Food in the Last Year: Sometimes true  Transportation Needs: No Transportation Needs (07/07/2024)   PRAPARE - Administrator, Civil Service (Medical): No    Lack of Transportation (Non-Medical): No  Physical Activity: Insufficiently Active (07/07/2024)   Exercise Vital Sign    Days of Exercise per Week: 4 days    Minutes of Exercise per Session: 30 min  Stress: No Stress Concern Present (07/07/2024)   [employer] of Occupational Health - Occupational Stress Questionnaire    Feeling of Stress: Not at all  Social Connections: Moderately Integrated (07/07/2024)   Social Connection and  Isolation Panel    Frequency of Communication with Friends and Family: More than three times a week    Frequency of Social Gatherings with Friends and Family: Twice a week    Attends Religious Services: More than 4 times per year    Active Member of [employer] or Organizations: Yes    Attends Engineer, structural: More than 4 times per year    Marital Status: Never married    Tobacco Counseling Counseling given: Not Answered    Clinical Intake:  Pre-visit preparation completed: Yes  Pain : No/denies pain     BMI - recorded: 22.44 Nutritional Status: BMI of 19-24  Normal Diabetes: No  No results found for: HGBA1C   How often do you need to have someone help you when you read instructions, pamphlets, or other written materials from your doctor or pharmacy?: 1 - Never  Interpreter Needed?: No  Information entered by :: Eleny Cortez, RMA   Activities of Daily Living     07/07/2024    5:57 PM  In your present state of health, do you have any difficulty performing the following activities:  Hearing? 0  Vision? 0  Difficulty concentrating or making decisions? 0  Walking or climbing stairs? 0  Dressing or bathing? 0  Doing errands, shopping? 0  Preparing Food and eating ? N  Using the Toilet? N  In the past six months, have you accidently leaked urine? N  Do you have problems with loss of bowel control? N  Managing your Medications? N  Managing your Finances? N  Housekeeping or managing your Housekeeping? N    Patient Care Team: Joshua Debby CROME, MD as PCP - General (Internal Medicine) Armond Cape, MD as Consulting Physician (Obstetrics and Gynecology) [facility], [facility] Charlyne Helling, MD as Referring Physician (Neurology) Mai Lynwood FALCON, MD as Consulting Physician (Rheumatology) Reda Hair, MD as Referring Physician (Dermatology)  I have updated your Care Teams any recent Medical Services you may have received from other providers in the past  year.     Assessment:   This is a routine wellness examination for [place].  Hearing/Vision screen Hearing Screening - Comments:: Denies hearing difficulties   Vision Screening - Comments:: Has eyeglasses but does not wear them/[facility]   Goals Addressed   None    Depression Screen     08/20/2024    3:46 PM 07/14/2024    9:09 AM 01/06/2024    9:06 AM 07/03/2023    1:19 PM 07/23/2022    4:11 PM  PHQ 2/9 Scores  PHQ - 2 Score 0 0 0 0 0  PHQ- 9 Score 0 0 0 0 2    Fall Risk     07/14/2024    9:09 AM 07/07/2024    5:57 PM 01/06/2024    9:05 AM 07/03/2023   11:05 AM 07/23/2022    4:12 PM  Fall Risk   Falls in the past year? 0 0 0 0 0  Number falls in past yr: 0 0 0 0 0  Injury with Fall? 0  0 0 0  Risk for fall due to : No Fall Risks  No Fall Risks  No Fall Risks  Follow up Falls evaluation completed Falls evaluation completed;Falls prevention discussed Falls evaluation completed Falls evaluation completed;Falls prevention discussed Falls evaluation completed      Data saved with a previous flowsheet row definition    MEDICARE RISK AT HOME:  Medicare Risk at Home Any stairs in or around the home?: (Patient-Rptd) Yes If so, are there any without handrails?: (Patient-Rptd) No Home free of loose throw rugs in walkways, pet beds, electrical cords, etc?: (Patient-Rptd) Yes Adequate lighting in your home to reduce risk of falls?: (Patient-Rptd) Yes Life alert?: (Patient-Rptd) No Use of a cane, walker or w/c?: (Patient-Rptd) No Grab bars in the bathroom?: (Patient-Rptd) No Shower chair or bench in shower?: (Patient-Rptd) No Elevated toilet seat or a handicapped toilet?: (Patient-Rptd) No  TIMED UP AND GO:  Was the test performed?  No  Cognitive Function: Declined/Normal: No cognitive concerns noted by patient or family. Patient alert, oriented, able to answer questions appropriately and recall recent events. No signs of memory loss or confusion.         Immunizations Immunization History  Administered Date(s) Administered   Influenza Inj Mdck Quad Pf 09/11/2017   Influenza Split 08/03/2013, 10/14/2014, 09/11/2017  Influenza, Seasonal, Injecte, Preservative Fre 07/09/2023   Influenza,inj,Quad PF,6+ Mos 08/05/2018   Influenza,inj,quad, With Preservative 08/04/2016, 09/11/2017   Influenza-Unspecified 08/05/2018   Moderna Sars-Covid-2 Vaccination 01/10/2020, 02/07/2020, 07/13/2020   PNEUMOCOCCAL CONJUGATE-20 05/17/2022   Pfizer Covid-19 Vaccine Bivalent Booster 94yrs & up 05/11/2021, 02/08/2022   Pneumococcal Polysaccharide-23 08/03/2013   Tdap 06/04/2010, 05/17/2022   Zoster Recombinant(Shingrix ) 01/10/2024    Screening Tests Health Maintenance  Topic Date Due   Hepatitis B Vaccines 19-59 Average Risk (1 of 3 - 19+ 3-dose series) Never done   Zoster Vaccines- Shingrix  (2 of 2) 03/06/2024   Medicare Annual Wellness (AWV)  07/02/2024   COVID-19 Vaccine (6 - 2025-26 season) 07/05/2024   Mammogram  01/14/2025   Fecal DNA (Cologuard)  05/15/2025   Cervical Cancer Screening (HPV/Pap Cotest)  01/14/2026   DTaP/Tdap/Td (3 - Td or Tdap) 05/17/2032   Pneumococcal Vaccine: 50+ Years  Completed   Hepatitis C Screening  Completed   HIV Screening  Completed   HPV VACCINES  Aged Out   Meningococcal B Vaccine  Aged Out   Influenza Vaccine  Discontinued    Health Maintenance Items Addressed: Vaccines Due: 2nd shingrix  and hep B, See Nurse Notes at the end of this note  Additional Screening:  Vision Screening: Recommended annual ophthalmology exams for early detection of glaucoma and other disorders of the eye. Is the patient up to date with their annual eye exam?  Yes  Who is the provider or what is the name of the office in which the patient attends annual eye exams? [facility]/Per pt-up to date  Dental Screening: Recommended annual dental exams for proper oral hygiene  Community Resource Referral / Chronic Care  Management: CRR required this visit?  No   CCM required this visit?  No   Plan:    I have personally reviewed and noted the following in the patient's chart:   Medical and social history Use of alcohol, tobacco or illicit drugs  Current medications and supplements including opioid prescriptions. Patient is not currently taking opioid prescriptions. Functional ability and status Nutritional status Physical activity Advanced directives List of other physicians Hospitalizations, surgeries, and ER visits in previous 12 months Vitals Screenings to include cognitive, depression, and falls Referrals and appointments  In addition, I have reviewed and discussed with patient certain preventive protocols, quality metrics, and best practice recommendations. A written personalized care plan for preventive services as well as general preventive health recommendations were provided to patient.   Rhylen Shaheen L Ellieanna Funderburg, CMA   08/20/2024   After Visit Summary: (MyChart) Due to this being a telephonic visit, the after visit summary with patients personalized plan was offered to patient via MyChart   Notes: Patient is due for a 2nd shingles vaccine.  She is also due for a Hep B vaccine, however, she would like to discuss with provider during her next office visit.  Patient had no concerns to address today.

## 2024-08-20 NOTE — Patient Instructions (Signed)
 Kathryn Collier,  Thank you for taking the time for your Medicare Wellness Visit. I appreciate your continued commitment to your health goals. Please review the care plan we discussed, and feel free to reach out if I can assist you further.  Medicare recommends these wellness visits once per year to help you and your care team stay ahead of potential health issues. These visits are designed to focus on prevention, allowing your provider to concentrate on managing your acute and chronic conditions during your regular appointments.  Please note that Annual Wellness Visits do not include a physical exam. Some assessments may be limited, especially if the visit was conducted virtually. If needed, we may recommend a separate in-person follow-up with your provider.  Ongoing Care Seeing your primary care provider every 3 to 6 months helps us  monitor your health and provide consistent, personalized care. Last office visit on 07/14/2024.  You are due for a for a 2nd shingles vaccine.  You are also due for a hep B vaccine, which you can discuss with Dr. Joshua duriong your next office visit.    Referrals If a referral was made during today's visit and you haven't received any updates within two weeks, please contact the referred provider directly to check on the status.  Recommended Screenings:  Health Maintenance  Topic Date Due   Hepatitis B Vaccine (1 of 3 - 19+ 3-dose series) Never done   Zoster (Shingles) Vaccine (1 of 2) Never done   Medicare Annual Wellness Visit  07/02/2024   COVID-19 Vaccine (6 - 2025-26 season) 07/05/2024   Breast Cancer Screening  01/14/2025   Cologuard (Stool DNA test)  05/15/2025   Pap with HPV screening  01/14/2026   DTaP/Tdap/Td vaccine (3 - Td or Tdap) 05/17/2032   Pneumococcal Vaccine for age over 100  Completed   Hepatitis C Screening  Completed   HIV Screening  Completed   HPV Vaccine  Aged Out   Meningitis B Vaccine  Aged Out   Flu Shot  Discontinued        08/20/2024    3:44 PM  Advanced Directives  Does Patient Have a Medical Advance Directive? No   Advance Care Planning is important because it: Ensures you receive medical care that aligns with your values, goals, and preferences. Provides guidance to your family and loved ones, reducing the emotional burden of decision-making during critical moments.  Vision: Annual vision screenings are recommended for early detection of glaucoma, cataracts, and diabetic retinopathy. These exams can also reveal signs of chronic conditions such as diabetes and high blood pressure.  Dental: Annual dental screenings help detect early signs of oral cancer, gum disease, and other conditions linked to overall health, including heart disease and diabetes.  Please see the attached documents for additional preventive care recommendations.

## 2024-08-31 DIAGNOSIS — M329 Systemic lupus erythematosus, unspecified: Secondary | ICD-10-CM | POA: Diagnosis not present

## 2024-09-02 ENCOUNTER — Other Ambulatory Visit: Payer: Self-pay | Admitting: Internal Medicine

## 2024-09-02 DIAGNOSIS — J4521 Mild intermittent asthma with (acute) exacerbation: Secondary | ICD-10-CM

## 2024-09-20 ENCOUNTER — Ambulatory Visit: Payer: Self-pay

## 2024-09-20 ENCOUNTER — Ambulatory Visit: Payer: Self-pay | Admitting: *Deleted

## 2024-09-20 NOTE — Telephone Encounter (Signed)
 Duplicate CRM, see previous nurse triage note.

## 2024-09-20 NOTE — Telephone Encounter (Signed)
 FYI. Unable to reach patient [facility] so that I could check to see how she was doing and offer her an appointment

## 2024-09-20 NOTE — Telephone Encounter (Signed)
 Recommended ED patient declined requesting call back from PCP due to hx of lupus . CAL notified.      FYI Only or Action Required?: FYI only for provider: ED advised and patient declined .  Patient was last seen in primary care on 07/14/2024 by Joshua Debby CROME, MD.  Called Nurse Triage reporting Pain.  Symptoms began several weeks ago.  Interventions attempted: OTC medications: CBD oil, Prescription medications: see multiple Rx for lupus and eye medication, Rest, hydration, or home remedies, and Dietary changes.  Symptoms are: gradually worsening. Changes in breathing at times. Denies now .  Triage Disposition: Go to ED Now (or PCP Triage)  Patient/caregiver understands and will follow disposition?: No, wishes to speak with PCP      Copied from CRM #[id number]. Topic: Clinical - Red Word Triage >> Sep 20, 2024  8:53 AM Carlyon D wrote: Red Word that prompted transfer to Nurse Triage: Pt's lupus is acting up, pain all over the body. Reason for Disposition  Patient sounds very sick or weak to the triager  Answer Assessment - Initial Assessment Questions Recommended ED due to sx of double vision and numbness in face. 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 Reports worsening pain all over. Double vision left eye x 2 weeks. Patient wants PCP to complete form for handicap sticker. Patient requesting call back. Patient reports she will call her neurologist she does not want to go to ED. CAL notified.       1. ONSET: When did the muscle aches or body pains start?      All over body  2. LOCATION: What part of your body is hurting? (e.g., entire body, arms, legs)      All over body  3. SEVERITY: How bad is the pain? (Scale 1-10; or mild, moderate, severe)     Jaw joints, spine, and hands. 4. CAUSE: What do you think is causing the pains?     lupus 5. FEVER: Do you have a fever? If Yes, ask: What is your temperature, how was it measured, and  when did it  start?      Na  6. OTHER SYMPTOMS: Do you have any other symptoms? (e.g., chest pain, cold or flu symptoms, rash, weakness, weight loss)     No chest pain noted more changes in breathing x 4 weeks but denies difficulty breathing now . Pain aches all over body. Double vision x 2 weeks ago, left eye. Left side of face numbness, can smile even .  7. PREGNANCY: Is there any chance you are pregnant? When was your last menstrual period?     na 8. TRAVEL: Have you traveled out of the country in the last month? (e.g., exposures, travel history)     na  Protocols used: Muscle Aches and Body Pain-A-AH

## 2024-09-20 NOTE — Telephone Encounter (Signed)
 Patient calling back from missing the call from [facility]. Patient in no acute distress at this time. Appointment scheduled for the patient tomorrow, 11/18.

## 2024-10-05 ENCOUNTER — Other Ambulatory Visit: Payer: Self-pay | Admitting: Internal Medicine

## 2024-10-05 DIAGNOSIS — J4521 Mild intermittent asthma with (acute) exacerbation: Secondary | ICD-10-CM

## 2024-10-07 ENCOUNTER — Other Ambulatory Visit: Payer: Self-pay | Admitting: Internal Medicine

## 2024-10-07 DIAGNOSIS — E063 Autoimmune thyroiditis: Secondary | ICD-10-CM

## 2024-10-13 ENCOUNTER — Ambulatory Visit: Admitting: Family Medicine

## 2024-10-13 ENCOUNTER — Ambulatory Visit: Payer: Self-pay | Admitting: Family Medicine

## 2024-10-13 ENCOUNTER — Ambulatory Visit (HOSPITAL_BASED_OUTPATIENT_CLINIC_OR_DEPARTMENT_OTHER)
Admission: RE | Admit: 2024-10-13 | Discharge: 2024-10-13 | Disposition: A | Discharge status: Home / Self Care | Source: Ambulatory Visit | Attending: Family Medicine | Admitting: Family Medicine

## 2024-10-13 ENCOUNTER — Encounter: Payer: Self-pay | Admitting: Family Medicine

## 2024-10-13 ENCOUNTER — Ambulatory Visit: Payer: Self-pay | Admitting: *Deleted

## 2024-10-13 VITALS — BP 118/64 | HR 114 | Temp 98.2°F | Resp 15 | Ht 65.75 in | Wt 142.8 lb

## 2024-10-13 DIAGNOSIS — R0781 Pleurodynia: Secondary | ICD-10-CM

## 2024-10-13 DIAGNOSIS — M329 Systemic lupus erythematosus, unspecified: Secondary | ICD-10-CM

## 2024-10-13 DIAGNOSIS — J029 Acute pharyngitis, unspecified: Secondary | ICD-10-CM | POA: Diagnosis not present

## 2024-10-13 DIAGNOSIS — J45909 Unspecified asthma, uncomplicated: Secondary | ICD-10-CM

## 2024-10-13 DIAGNOSIS — G7000 Myasthenia gravis without (acute) exacerbation: Secondary | ICD-10-CM

## 2024-10-13 LAB — POC INFLUENZA A&B (BINAX/QUICKVUE)
Influenza A, POC: NEGATIVE
Influenza B, POC: NEGATIVE

## 2024-10-13 LAB — POC COVID19 BINAXNOW: SARS Coronavirus 2 Ag: NEGATIVE

## 2024-10-13 LAB — POCT RAPID STREP A (OFFICE): Rapid Strep A Screen: NEGATIVE

## 2024-10-13 MED ORDER — SPACER/AERO-HOLDING CHAMBERS DEVI: 0 refills | Status: AC

## 2024-10-13 NOTE — Patient Instructions (Addendum)
 As we discussed it is possible that you have a viral illness, and that some of your discomfort could be related to asthma.  Strep testing, flu and COVID test were all negative here in the office.  Okay to continue to use Airsupra .  I sent in a spacer to make that easier.  Tylenol as needed for body aches, fluids and rest as you are able..  Please have x-ray performed at the location below and I will check some additional blood work to rule out some other less likely causes.  If any worsening of symptoms please be seen.  Again we will have some more information once I see your lab results and x-ray results.  Hang in there!  [facility] or xray: Walk in 8:30-4:30 during weekdays, no appointment needed [address]  [place], [place] [address]   Nonspecific Chest Pain Chest pain can be caused by many different conditions. Some causes of chest pain can be life-threatening. These will require treatment right away. Serious causes of chest pain include: Heart attack. A tear in the body's main blood vessel. Redness and swelling (inflammation) around your heart. Blood clot in your lungs. Other causes of chest pain may not be so serious. These include: Heartburn. Anxiety or stress. Damage to bones or muscles in your chest. Lung infections. Chest pain can feel like: Pain or discomfort in your chest. Crushing, pressure, aching, or squeezing pain. Burning or tingling. Dull or sharp pain that is worse when you move, cough, or take a deep breath. Pain or discomfort that is also felt in your back, neck, jaw, shoulder, or arm, or pain that spreads to any of these areas. It is hard to know whether your pain is caused by something that is serious or something that is not so serious. So it is important to see your doctor right away if you have chest pain. Follow these instructions at home: Medicines Take over-the-counter and prescription medicines only as told by your doctor. If you were prescribed an  antibiotic medicine, take it as told by your doctor. Do not stop taking the antibiotic even if you start to feel better. Lifestyle  Rest as told by your doctor. Do not use any products that contain nicotine or tobacco, such as cigarettes, e-cigarettes, and chewing tobacco. If you need help quitting, ask your doctor. Do not drink alcohol. Make lifestyle changes as told by your doctor. These may include: Getting regular exercise. Ask your doctor what activities are safe for you. Eating a heart-healthy diet. A diet and nutrition specialist (dietitian) can help you to learn healthy eating options. Staying at a healthy weight. Treating diabetes or high blood pressure, if needed. Lowering your stress. Activities such as yoga and relaxation techniques can help. General instructions Pay attention to any changes in your symptoms. Tell your doctor about them or any new symptoms. Avoid any activities that cause chest pain. Keep all follow-up visits as told by your doctor. This is important. You may need more testing if your chest pain does not go away. Contact a doctor if: Your chest pain does not go away. You feel depressed. You have a fever. Get help right away if: Your chest pain is worse. You have a cough that gets worse, or you cough up blood. You have very bad (severe) pain in your belly (abdomen). You pass out (faint). You have either of these for no clear reason: Sudden chest discomfort. Sudden discomfort in your arms, back, neck, or jaw. You have  shortness of breath at any time. You suddenly start to sweat, or your skin gets clammy. You feel sick to your stomach (nauseous). You throw up (vomit). You suddenly feel lightheaded or dizzy. You feel very weak or tired. Your heart starts to beat fast, or it feels like it is skipping beats. These symptoms may be an emergency. Do not wait to see if the symptoms will go away. Get medical help right away. Call your local emergency services (911  in the [place] Do not drive yourself to the hospital. Summary Chest pain can be caused by many different conditions. The cause may be serious and need treatment right away. If you have chest pain, see your doctor right away. Follow your doctor's instructions for taking medicines and making lifestyle changes. Keep all follow-up visits as told by your doctor. This includes visits for any further testing if your chest pain does not go away. Be sure to know the signs that show that your condition has become worse. Get help right away if you have these symptoms. This information is not intended to replace advice given to you by your health care provider. Make sure you discuss any questions you have with your health care provider. Document Revised: 09/05/2022 Document Reviewed: 09/05/2022 Elsevier Patient Education  2024 [employer].

## 2024-10-13 NOTE — Progress Notes (Signed)
 Subjective:  Patient ID: Kathryn Collier, female    DOB: February 25, 1971  Age: 53 y.o. MRN: [id number]  CC:  Chief Complaint  Patient presents with   Breathing Problem    Patient is having painful breathing. Sx started 3 days ago. Has been in a lupus flare for 3 weeks. Had a flu shot last Wednesday. ST for 4 days. Chest pain on inhaling. Last night was painful. She feels like she's been running in the cold and that is what her lungs feel like.     HPI RAELI WIENS presents for  Acute visit for above, PCP is Dr. Joshua.   Dyspnea, chest pain As above, she has a history of lupus with a flare for the past few weeks. -achy all over., muscle soreness, joint soreness, spine concerns.  Sore throat started 5 days ago. Flu shot last Wednesday. Sore throat 2 days later. Sinus congestion past 3-4 days, PND.  Chest soreness past 3 days. Painful to breathe in. Feels like soreness of breathing in cold air. Pain to breathe in.  Not having chest pressure.  Some HA, soreness in neck. Worse with lying.  Sore to breathe in.  Pain with breathing better during the day, worse at night.  More constant since yesterday.   No hx of DVT. Drove to [place], last weekend, few hours, no other long distance travel. Contact with many people few days ago. Left leg swollen in lower leg yesterday, better today. No calf pain.   4 weeks ago - MG flare - blind spot in vision, plan for IVIG - not yet used.   On chart review she does have a history of moderate asthma, myasthenia gravis, lupus, immunocompromise with plaquenil.  She has been treated with Qvar  40 micrograms 1 puff twice daily, Singulair  10 mg for asthma with Airsupra  as needed, 2 puffs 4 times daily as needed.  On chart review from September visit with PCP, Qvar  had been discontinued previously due to recurrent thrush.  Asthma flares earlier in the year with seasonal changes.   Taking singulair  daily.  Has been using Airsupra  once daily past few weeks, then 2 times  per day past 3 days. No audible wheeze. Some benefit with easier to breathe with airsupra .   No fever.  Drinking fluids normally.  No known sick contacts.   Tx: tylenol for chest pain - helped.   Home covid test negative yesterday.   History Patient Active Problem List   Diagnosis Date Noted   Idiopathic hypotension 07/14/2024   Abnormal cervical Papanicolaou smear 02/17/2024   Need for prophylactic vaccination and inoculation against varicella 01/07/2024   Seasonal allergies 01/06/2024   Screening for ischemic heart disease 01/06/2024   Flu vaccine need 07/09/2023   Mild intermittent asthma with exacerbation 01/01/2023   Encounter for initial prescription of contraceptive pills 05/10/2022   Fibromyalgia 05/09/2022   Chronic pain disorder 05/09/2022   Screen for colon cancer 05/09/2022   Screening mammogram for breast cancer 05/09/2022   Severe episode of recurrent major depressive disorder, without psychotic features (HCC) 05/08/2022   Moderate asthma 11/08/2019   Immunocompromised 06/07/2019   Recurrent major depressive disorder, in full remission 06/07/2019   Myasthenia gravis (HCC) 12/23/2017   Herpes simplex 06/17/2017   Hashimoto's disease 12/06/2016   Vitamin D deficiency 05/31/2016   Systemic lupus erythematosus (HCC) 10/26/2015   Hypothyroidism 10/26/2015   Long-term use of hydroxychloroquine 10/26/2015   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   Arthritis 1993   Inflammation, stiffness, pain and changes in r   Asthma 1995   Diagnosed as exercise-induced asthma in 1995.   Connective tissue disease    Connective tissue disease    undifferentiated   Depression 1992   Hashimoto's disease    Ocular myasthenia    Systemic lupus (HCC)    Past Surgical History:  Procedure Laterality Date    WISDOM TOOTH EXTRACTION     Allergies  Allergen Reactions   Corticosteroids Other (See Comments)    Rage, irritability, dperession   Other     Mold, dust mites, pollens of trees/grass   Prednisone Other (See Comments)    depression, rage, irritability   Sertraline Other (See Comments)    No SSRIs   Sulfa Antibiotics     Not supposed to take d/t lupus   Prior to Admission medications   Medication Sig Start Date End Date Taking? Authorizing Provider  AIRSUPRA  90-80 MCG/ACT AERO INHALE 2 PUFFS INTO THE LUNGS FOUR TIMES DAILY AS NEEDED 09/03/24  Yes Joshua Debby CROME, MD  b complex vitamins capsule Take by mouth.   Yes [provider]  beclomethasone (QVAR  REDIHALER) 40 MCG/ACT inhaler Inhale 1 puff into the lungs 2 (two) times daily. 07/09/23  Yes Joshua Debby CROME, MD  Bromelains 500 MG TABS Take by mouth 3 (three) times daily.   Yes [provider]  buPROPion  (WELLBUTRIN  SR) 150 MG 12 hr tablet TAKE 1 TABLET(150 MG) BY MOUTH TWICE DAILY 08/18/24  Yes Joshua Debby CROME, MD  Cholecalciferol 25 MCG (1000 UT) tablet Take by mouth.   Yes [provider]  cyclobenzaprine (FLEXERIL) 10 MG tablet Take 10 mg by mouth 3 (three) times daily as needed for muscle spasms.   Yes [provider]  diclofenac (VOLTAREN) 75 MG EC tablet Take 75 mg by mouth 2 (two) times daily.   Yes [provider]  DULoxetine  (CYMBALTA ) 30 MG capsule TAKE 3 CAPSULE BY MOUTH DAILY 08/16/24  Yes Joshua Debby CROME, MD  hydroxychloroquine (PLAQUENIL) 200 MG tablet Take by mouth 2 (two) times daily. Patient taking differently: Take 200 mg by mouth daily.   Yes [provider]  levothyroxine  (SYNTHROID ) 200 MCG tablet TAKE 1 TABLET(200 MCG) BY MOUTH DAILY 10/08/24  Yes Joshua Debby CROME, MD  montelukast  (SINGULAIR ) 10 MG tablet TAKE 1 TABLET(10 MG) BY MOUTH AT BEDTIME 10/05/24  Yes Joshua Debby CROME, MD  NON FORMULARY    Yes [provider]  NON FORMULARY    Yes [provider]  NON FORMULARY 500 mg in the morning and at bedtime.   Yes [provider]  NON FORMULARY daily.   Yes [provider]  Omega-3 1000 MG CAPS Take by mouth.   Yes [provider]  OVER THE COUNTER MEDICATION    Yes [provider]  pyridostigmine  (MESTINON ) 60 MG tablet Take 30 mg by mouth as needed. Patient not taking: Reported on 10/13/2024 01/15/24   [provider]   Social History   Socioeconomic History   Marital status: Single    Spouse name: Not on file   Number of children: 0   Years of education: 17   Highest education level: Bachelor's degree (e.g., BA, AB, BS)  Occupational History   Occupation: Disabled  Tobacco Use   Smoking status: Never  Passive exposure: Never   Smokeless tobacco: Never  Vaping Use   Vaping status: Never Used  Substance and Sexual Activity   Alcohol use: Yes    Alcohol/week: 18.0 standard drinks of alcohol    Types: 16 Glasses of wine, 2 Cans of beer per week   Drug use: Yes    Types: Marijuana   Sexual activity: Not Currently    Partners: Male  Other Topics Concern   Not on file  Social History Narrative   Lives alone/2025   Caffeine- coffee, 1 cup daily   Social Drivers of Health   Financial Resource Strain: Medium Risk (07/07/2024)   Overall Financial Resource Strain (CARDIA)    Difficulty of Paying Living Expenses: Somewhat hard  Food Insecurity: Food Insecurity Present (07/07/2024)   Hunger Vital Sign    Worried About Running Out of Food in the Last Year: Sometimes true    Ran Out of Food in the Last Year: Sometimes true  Transportation Needs: No Transportation Needs (07/07/2024)   PRAPARE - Administrator, Civil Service (Medical): No    Lack of Transportation (Non-Medical): No  Physical Activity: Insufficiently Active (07/07/2024)   Exercise Vital Sign    Days of Exercise per Week: 4 days    Minutes of Exercise per Session: 30 min  Stress: No Stress Concern  Present (07/07/2024)   [employer] of Occupational Health - Occupational Stress Questionnaire    Feeling of Stress: Not at all  Social Connections: Moderately Integrated (07/07/2024)   Social Connection and Isolation Panel    Frequency of Communication with Friends and Family: More than three times a week    Frequency of Social Gatherings with Friends and Family: Twice a week    Attends Religious Services: More than 4 times per year    Active Member of [employer] or Organizations: Yes    Attends Engineer, Structural: More than 4 times per year    Marital Status: Never married  Intimate Partner Violence: Patient Unable To Answer (08/20/2024)   Humiliation, Afraid, Rape, and Kick questionnaire    Fear of Current or Ex-Partner: Patient unable to answer    Emotionally Abused: Patient unable to answer    Physically Abused: Patient unable to answer    Sexually Abused: Patient unable to answer    Review of Systems   Objective:   Vitals:   10/13/24 1434  BP: 118/64  Pulse: (!) 114  Resp: 15  Temp: 98.2 F (36.8 C)  TempSrc: Temporal  SpO2: 96%  Weight: 142 lb 12.8 oz (64.8 kg)  Height: 5' 5.75 (1.67 m)     Physical Exam Vitals reviewed.  Constitutional:      General: She is not in acute distress.    Appearance: She is well-developed.  HENT:     Head: Normocephalic and atraumatic.     Right Ear: Hearing, tympanic membrane, ear canal and external ear normal.     Left Ear: Hearing, tympanic membrane, ear canal and external ear normal.     Nose: Nose normal.     Mouth/Throat:     Mouth: Mucous membranes are moist.     Pharynx: Oropharynx is clear. No oropharyngeal exudate or posterior oropharyngeal erythema.  Eyes:     Conjunctiva/sclera: Conjunctivae normal.     Pupils: Pupils are equal, round, and reactive to light.  Cardiovascular:     Rate and Rhythm: Normal rate and regular rhythm.     Heart sounds: Normal heart sounds. No murmur heard.  Pulmonary:     Effort:  Pulmonary effort is normal. No respiratory distress.     Breath sounds: Normal breath sounds. No wheezing or rhonchi.     Comments: Speaking full sentences without respiratory distress.  No audible wheeze, lungs clear on exam without wheezes, rales, rhonchi.  Good air movement. Musculoskeletal:     Right lower leg: No edema.     Left lower leg: No edema.     Comments: Calf circumference equal at 35 cm, 15 cm below patella.  Negative Homans, no cords, nontender with palpation.  Lymphadenopathy:     Cervical: No cervical adenopathy.  Skin:    General: Skin is warm and dry.     Findings: No rash.  Neurological:     Mental Status: She is alert and oriented to person, place, and time.  Psychiatric:        Mood and Affect: Mood normal.        Behavior: Behavior normal.     Assessment & Plan:  MAYANA IRIGOYEN is a 53 y.o. female . Pleuritic chest pain - Plan: CBC, D-Dimer, Quantitative, DG Chest 2 View  Sore throat - Plan: POCT rapid strep A, CBC, POC Influenza A&B(BINAX/QUICKVUE), POC COVID-19 BinaxNow  Systemic lupus erythematosus, unspecified SLE type, unspecified organ involvement status (HCC)  Myasthenia gravis (HCC)  Moderate asthma, unspecified whether complicated, unspecified whether persistent - Plan: Spacer/Aero-Holding Chambers DEVI  Sore throat, pleuritic chest pain as above.  Appears to be viral illness, reassuring in office strep flu and COVID testing.  Underlying history of lupus and myasthenia gravis along with asthma with possible flare.  She does have Airsupra , spacer provided for possible more effective treatment.  CBC reassuring without true leukocytosis.  D-dimer was reassuring, unlikely PE.  Chest x-ray was reassuring, no sign of pneumonia.  Without significant wheeze on exam and her previous reactions to prednisone we will hold on corticosteroids at this time.  Symptomatic care discussed with RTC/ER precautions given.     Meds ordered this encounter  Medications    Spacer/Aero-Holding Raguel DEVI    Sig: Use as directed with inhaler.    Dispense:  1 each    Refill:  0   Patient Instructions  As we discussed it is possible that you have a viral illness, and that some of your discomfort could be related to asthma.  Okay to continue to use Airsupra .  I sent in a spacer to make that easier.  Tylenol as needed for body aches, fluids and rest as you are able..  Please have x-ray performed at the location below and I will check some additional blood work to rule out some other less likely causes.  If any worsening of symptoms please be seen.  Again we will have some more information once I see your lab results and x-ray results.  Hang in there!  Nonspecific Chest Pain Chest pain can be caused by many different conditions. Some causes of chest pain can be life-threatening. These will require treatment right away. Serious causes of chest pain include: Heart attack. A tear in the body's main blood vessel. Redness and swelling (inflammation) around your heart. Blood clot in your lungs. Other causes of chest pain may not be so serious. These include: Heartburn. Anxiety or stress. Damage to bones or muscles in your chest. Lung infections. Chest pain can feel like: Pain or discomfort in your chest. Crushing, pressure, aching, or squeezing pain. Burning or tingling. Dull or sharp pain that is worse when you  move, cough, or take a deep breath. Pain or discomfort that is also felt in your back, neck, jaw, shoulder, or arm, or pain that spreads to any of these areas. It is hard to know whether your pain is caused by something that is serious or something that is not so serious. So it is important to see your doctor right away if you have chest pain. Follow these instructions at home: Medicines Take over-the-counter and prescription medicines only as told by your doctor. If you were prescribed an antibiotic medicine, take it as told by your doctor. Do not stop  taking the antibiotic even if you start to feel better. Lifestyle  Rest as told by your doctor. Do not use any products that contain nicotine or tobacco, such as cigarettes, e-cigarettes, and chewing tobacco. If you need help quitting, ask your doctor. Do not drink alcohol. Make lifestyle changes as told by your doctor. These may include: Getting regular exercise. Ask your doctor what activities are safe for you. Eating a heart-healthy diet. A diet and nutrition specialist (dietitian) can help you to learn healthy eating options. Staying at a healthy weight. Treating diabetes or high blood pressure, if needed. Lowering your stress. Activities such as yoga and relaxation techniques can help. General instructions Pay attention to any changes in your symptoms. Tell your doctor about them or any new symptoms. Avoid any activities that cause chest pain. Keep all follow-up visits as told by your doctor. This is important. You may need more testing if your chest pain does not go away. Contact a doctor if: Your chest pain does not go away. You feel depressed. You have a fever. Get help right away if: Your chest pain is worse. You have a cough that gets worse, or you cough up blood. You have very bad (severe) pain in your belly (abdomen). You pass out (faint). You have either of these for no clear reason: Sudden chest discomfort. Sudden discomfort in your arms, back, neck, or jaw. You have shortness of breath at any time. You suddenly start to sweat, or your skin gets clammy. You feel sick to your stomach (nauseous). You throw up (vomit). You suddenly feel lightheaded or dizzy. You feel very weak or tired. Your heart starts to beat fast, or it feels like it is skipping beats. These symptoms may be an emergency. Do not wait to see if the symptoms will go away. Get medical help right away. Call your local emergency services (911 in the [place] Do not drive yourself to the  hospital. Summary Chest pain can be caused by many different conditions. The cause may be serious and need treatment right away. If you have chest pain, see your doctor right away. Follow your doctor's instructions for taking medicines and making lifestyle changes. Keep all follow-up visits as told by your doctor. This includes visits for any further testing if your chest pain does not go away. Be sure to know the signs that show that your condition has become worse. Get help right away if you have these symptoms. This information is not intended to replace advice given to you by your health care provider. Make sure you discuss any questions you have with your health care provider. Document Revised: 09/05/2022 Document Reviewed: 09/05/2022 Elsevier Patient Education  2024 Elsevier Inc.    Signed,   Reyes Pines, MD [facility], [facility] 10/13/24 3:44 PM

## 2024-10-13 NOTE — Telephone Encounter (Signed)
 FYI Only or Action Required?: FYI only for provider: appointment scheduled on 12/10.  Patient was last seen in primary care on 07/14/2024 by Joshua Debby CROME, MD.  Called Nurse Triage reporting Asthma.  Symptoms began several days ago.  Interventions attempted: inhaler  Symptoms are: unchanged.  Triage Disposition: See HCP Within 4 Hours (Or PCP Triage)  Patient/caregiver understands and will follow disposition?: Yes  Copied from CRM #[id number]. Topic: Clinical - Red Word Triage >> Oct 13, 2024  8:07 AM Thersia BROCKS wrote: [facility] that prompted transfer to Nurse Triage: Patient has ashtma , is experiencing chest pain states hurts when she takes a deep breath    ----------------------------------------------------------------------- From previous Reason for Contact - Scheduling: Patient/patient representative is calling to schedule an appointment. Refer to attachments for appointment information. Reason for Disposition  [1] Longstanding difficulty breathing (e.g., CHF, COPD, emphysema) AND [2] WORSE than normal  Answer Assessment - Initial Assessment Questions Patient is calling to report she has pain with breathing in- this is the only symptoms she is presently having. Patient denies SOB, wheezing. Patient has acupuncture appointment this am and will not miss it- no open appointment at her PCP office- alternative location scheduled. She is aware seek care sooner if she has any changes   1. RESPIRATORY STATUS: Describe your breathing? (e.g., wheezing, shortness of breath, unable to speak, severe coughing)      Pain breathing in 2. ONSET: When did this breathing problem begin?      5 days 3. PATTERN Does the difficult breathing come and go, or has it been constant since it started?      Constant- interfering with sleep 4. SEVERITY: How bad is your breathing? (e.g., mild, moderate, severe)      painful 5. RECURRENT SYMPTOM: Have you had difficulty breathing before? If Yes, ask:  When was the last time? and What happened that time?      Hx asthma  7. LUNG HISTORY: Do you have any history of lung disease?  (e.g., pulmonary embolus, asthma, emphysema)     asthma 8. CAUSE: What do you think is causing the breathing problem?      Patient thinks she may have pneumonia  9. OTHER SYMPTOMS: Do you have any other symptoms? (e.g., chest pain, cough, dizziness, fever, runny nose)     no 10. O2 SATURATION MONITOR:  Do you use an oxygen saturation monitor (pulse oximeter) at home? If Yes, ask: What is your reading (oxygen level) today? What is your usual oxygen saturation reading? (e.g., 95%)       97% 102P  Protocols used: Breathing Difficulty-A-AH

## 2024-10-14 LAB — CBC
HCT: 35.5 % — ABNORMAL LOW (ref 36.0–46.0)
Hemoglobin: 12.1 g/dL (ref 12.0–15.0)
MCHC: 34.1 g/dL (ref 30.0–36.0)
MCV: 94.8 fl (ref 78.0–100.0)
Platelets: 273 K/uL (ref 150.0–400.0)
RBC: 3.74 Mil/uL — ABNORMAL LOW (ref 3.87–5.11)
RDW: 14.5 % (ref 11.5–15.5)
WBC: 10.2 K/uL (ref 4.0–10.5)

## 2024-10-14 LAB — D-DIMER, QUANTITATIVE: D-Dimer, Quant: 0.43 ug{FEU}/mL (ref <0.50)

## 2024-11-16 ENCOUNTER — Other Ambulatory Visit: Payer: Self-pay | Admitting: Internal Medicine

## 2024-12-02 LAB — OPHTHALMOLOGY REPORT-SCANNED

## 2025-01-11 ENCOUNTER — Ambulatory Visit: Admitting: Internal Medicine
